# Patient Record
Sex: Male | Born: 1965 | Race: Black or African American | Hispanic: No | Marital: Single | Smoking: Current every day smoker
Health system: Southern US, Community
[De-identification: ages and names within clinical notes are randomized; demographics above are authoritative.]

## PROBLEM LIST (undated history)

## (undated) DIAGNOSIS — F329 Major depressive disorder, single episode, unspecified: Secondary | ICD-10-CM

## (undated) DIAGNOSIS — F32A Depression, unspecified: Secondary | ICD-10-CM

## (undated) HISTORY — PX: MANDIBLE FRACTURE SURGERY: SHX706

---

## 2014-10-12 ENCOUNTER — Emergency Department (HOSPITAL_COMMUNITY)
Admission: EM | Admit: 2014-10-12 | Discharge: 2014-10-12 | Disposition: A | Discharge status: Home / Self Care | Payer: Medicaid - Out of State | Attending: Emergency Medicine | Admitting: Emergency Medicine

## 2014-10-12 ENCOUNTER — Encounter (HOSPITAL_COMMUNITY): Payer: Self-pay | Admitting: Emergency Medicine

## 2014-10-12 DIAGNOSIS — M62838 Other muscle spasm: Secondary | ICD-10-CM | POA: Diagnosis not present

## 2014-10-12 DIAGNOSIS — M546 Pain in thoracic spine: Secondary | ICD-10-CM | POA: Insufficient documentation

## 2014-10-12 DIAGNOSIS — M549 Dorsalgia, unspecified: Secondary | ICD-10-CM

## 2014-10-12 DIAGNOSIS — M25512 Pain in left shoulder: Secondary | ICD-10-CM | POA: Insufficient documentation

## 2014-10-12 DIAGNOSIS — Z720 Tobacco use: Secondary | ICD-10-CM | POA: Diagnosis not present

## 2014-10-12 DIAGNOSIS — M542 Cervicalgia: Secondary | ICD-10-CM | POA: Diagnosis present

## 2014-10-12 MED ORDER — METHOCARBAMOL 750 MG PO TABS: 750.0000 mg | ORAL_TABLET | Freq: Four times a day (QID) | ORAL | Status: DC | PRN

## 2014-10-12 MED ORDER — OXYCODONE-ACETAMINOPHEN 5-325 MG PO TABS
1.0000 | ORAL_TABLET | Freq: Once | ORAL | Status: AC
  Administered 2014-10-12: 1 via ORAL
  Filled 2014-10-12: qty 1

## 2014-10-12 MED ORDER — OXYCODONE-ACETAMINOPHEN 5-325 MG PO TABS: 1.0000 | ORAL_TABLET | ORAL | Status: DC | PRN

## 2014-10-12 NOTE — ED Notes (Signed)
Pt c/o left neck pain that radiates to left shoulder. Pt states he had injury to that area 30 years ago. Denies re-injury.

## 2014-10-12 NOTE — ED Notes (Signed)
Pt reports he is leaving with ALL belongings he arrived with. He also verbalizes that his aunt is driving him home. He is alert and ambulatory upon DC

## 2014-10-12 NOTE — ED Provider Notes (Signed)
CSN: [id number]     Arrival date & time 10/12/14  1023 History   First MD Initiated Contact with Patient 10/12/14 1041     Chief Complaint  Patient presents with  . Neck Pain     (Consider location/radiation/quality/duration/timing/severity/associated sxs/prior Treatment) HPI   Pt p/w left upper back and left shoulder pain without injury.  Pain has been gradually worsening x 3 weeks.  States he woke up feeling like he had stiff neck, the pain has progressed to include his left upper back and left shoulder, pain is burning and sharp and tearing.   He can make the pain happen by turning his head to the left.  Denies fevers, CP, SOB, weakness or numbness of the arm.  Denies injury or heavy lifting.  Has been taking ibuprofen and has taken vicodin without relief.    History reviewed. No pertinent past medical history. Past Surgical History  Procedure Laterality Date  . Mandible fracture surgery     No family history on file. History  Substance Use Topics  . Smoking status: Current Every Day Smoker  . Smokeless tobacco: Not on file  . Alcohol Use: Yes    Review of Systems  All other systems reviewed and are negative.     Allergies  Review of patient's allergies indicates no known allergies.  Home Medications   Prior to Admission medications   Not on File   BP 140/92 mmHg  Pulse 60  Temp(Src) 98.5 F (36.9 C) (Oral)  Resp 20  SpO2 100% Physical Exam  Constitutional: He appears well-developed and well-nourished. No distress.  HENT:  Head: Normocephalic and atraumatic.  Neck: Neck supple.  Pulmonary/Chest: Effort normal.  Musculoskeletal:       Back:  Spine nontender, no crepitus, or stepoffs. Upper extremities:  Strength 5/5, sensation intact, distal pulses intact.    Mild edema and tenderness over left trapezius.  No erythema.  No masses.    Neurological: He is alert.  Skin: He is not diaphoretic.  Nursing note and vitals reviewed.   ED Course  Procedures  (including critical care time) Labs Review Labs Reviewed - No data to display  Imaging Review No results found.   EKG Interpretation None      MDM   Final diagnoses:  Upper back pain on left side  Muscle spasm    Afebrile, nontoxic patient with left upper back and shoulder pain without injury.  Neurovascularly intact.  Suspect muscle spasm.   D/C home with robaxin, #10 percocet, PCP follow up.   Discussed result, findings, treatment, and follow up  with patient.  Pt given return precautions.  Pt verbalizes understanding and agrees with plan.        Trixie Dredgemily Emersen Mascari, PA-C 10/12/14 1417  Richardean Canalavid H Yao, MD 10/12/14 [phone]

## 2014-10-12 NOTE — Discharge Instructions (Signed)
Read the information below.  Use the prescribed medication as directed.  Please discuss all new medications with your pharmacist.  Do not take additional tylenol while taking the prescribed pain medication to avoid overdose.  You may return to the Emergency Department at any time for worsening condition or any new symptoms that concern you.  If you develop uncontrolled pain, weakness or numbness of the extremity, severe discoloration of the skin, or you are unable to move your arm, return to the ER for a recheck.       Muscle Cramps and Spasms Muscle cramps and spasms are when muscles tighten by themselves. They usually get better within minutes. Muscle cramps are painful. They are usually stronger and last longer than muscle spasms. Muscle spasms may or may not be painful. They can last a few seconds or much longer. HOME CARE  Drink enough fluid to keep your pee (urine) clear or pale yellow.  Massage, stretch, and relax the muscle.  Use a warm towel, heating pad, or warm shower water on tight muscles.  Place ice on the muscle if it is tender or in pain.  Put ice in a plastic bag.  Place a towel between your skin and the bag.  Leave the ice on for 15-20 minutes, 03-04 times a day.  Only take medicine as told by your doctor. GET HELP RIGHT AWAY IF:  Your cramps or spasms get worse, happen more often, or do not get better with time. MAKE SURE YOU:  Understand these instructions.  Will watch your condition.  Will get help right away if you are not doing well or get worse. Document Released: 10/26/2008 Document Revised: 03/10/2013 Document Reviewed: 10/30/2012 [facility] Patient Information 2015 [place], [place]. This information is not intended to replace advice given to you by your health care provider. Make sure you discuss any questions you have with your health care provider.  Musculoskeletal Pain Musculoskeletal pain is muscle and boney aches and pains. These pains can occur in any  part of the body. Your caregiver may treat you without knowing the cause of the pain. They may treat you if blood or urine tests, X-rays, and other tests were normal.  CAUSES There is often not a definite cause or reason for these pains. These pains may be caused by a type of germ (virus). The discomfort may also come from overuse. Overuse includes working out too hard when your body is not fit. Boney aches also come from weather changes. Bone is sensitive to atmospheric pressure changes. HOME CARE INSTRUCTIONS   Ask when your test results will be ready. Make sure you get your test results.  Only take over-the-counter or prescription medicines for pain, discomfort, or fever as directed by your caregiver. If you were given medications for your condition, do not drive, operate machinery or power tools, or sign legal documents for 24 hours. Do not drink alcohol. Do not take sleeping pills or other medications that may interfere with treatment.  Continue all activities unless the activities cause more pain. When the pain lessens, slowly resume normal activities. Gradually increase the intensity and duration of the activities or exercise.  During periods of severe pain, bed rest may be helpful. Lay or sit in any position that is comfortable.  Putting ice on the injured area.  Put ice in a bag.  Place a towel between your skin and the bag.  Leave the ice on for 15 to 20 minutes, 3 to 4 times a day.  Follow  up with your caregiver for continued problems and no reason can be found for the pain. If the pain becomes worse or does not go away, it may be necessary to repeat tests or do additional testing. Your caregiver may need to look further for a possible cause. SEEK IMMEDIATE MEDICAL CARE IF:  You have pain that is getting worse and is not relieved by medications.  You develop chest pain that is associated with shortness or breath, sweating, feeling sick to your stomach (nauseous), or throw up  (vomit).  Your pain becomes localized to the abdomen.  You develop any new symptoms that seem different or that concern you. MAKE SURE YOU:   Understand these instructions.  Will watch your condition.  Will get help right away if you are not doing well or get worse. Document Released: 11/13/2005 Document Revised: 02/05/2012 Document Reviewed: 07/18/2013 [facility] Patient Information 2015 [place]. This information is not intended to replace advice given to you by your health care provider. Make sure you discuss any questions you have with your health care provider.   Emergency Department Resource Guide 1) Find a Doctor and Pay Out of Pocket Although you won't have to find out who is covered by your insurance plan, it is a good idea to ask around and get recommendations. You will then need to call the office and see if the doctor you have chosen will accept you as a new patient and what types of options they offer for patients who are self-pay. Some doctors offer discounts or will set up payment plans for their patients who do not have insurance, but you will need to ask so you aren't surprised when you get to your appointment.  2) Contact Your Local Health Department Not all health departments have doctors that can see patients for sick visits, but many do, so it is worth a call to see if yours does. If you don't know where your local health department is, you can check in your phone book. The CDC also has a tool to help you locate your state's health department, and many state websites also have listings of all of their local health departments.  3) Find a Walk-in Clinic If your illness is not likely to be very severe or complicated, you may want to try a walk in clinic. These are popping up all over the country in pharmacies, drugstores, and shopping centers. They're usually staffed by nurse practitioners or physician assistants that have been trained to treat common illnesses and  complaints. They're usually fairly quick and inexpensive. However, if you have serious medical issues or chronic medical problems, these are probably not your best option.  No Primary Care Doctor: - Call Health Connect at  [phone] - they can help you locate a primary care doctor that  accepts your insurance, provides certain services, etc. - Physician Referral Service- [phone]  Chronic Pain Problems: Organization         Address  Phone   Notes  [facility]  [phone] Patients need to be referred by their primary care doctor.   Medication Assistance: Organization         Address  Phone   Notes  [facility] [address] [place], [place] [address] [phone] --Must be a resident of [facility] [place] -- Must have NO insurance coverage whatsoever (no Medicaid/ Medicare, etc.) -- The pt. MUST have a primary care doctor that directs their care regularly and  follows them in the community   [facility]ist  [phone]   [employer]  [phone]    Agencies that provide inexpensive medical care: Organization         Address  Phone   Notes  [facility]  [phone]   [facility]    [phone]   [facility] [address] [place], [place] [address] [phone]   [facility] [address], [place] [phone]   Planned Parenthood    [phone]   [facility]    [phone]   [facility]  [address], [place] Phone:  [phone], Fax:  [phone] Hours of Operation:  9 am - 6 pm, M-F.  Also accepts Medicaid/Medicare and self-pay.  [facility]  [address], [place] Phone: [phone], Fax: [phone]. Hours of Operation:  8:30 am - 5:30 pm, M-F.  Also accepts Medicaid and self-pay.  [facility] [place] [address] [place] Phone: [phone]   [facility] [address] Natasha Bence [place], [place] [phone], Ext. [phone] Mondays & Thursdays: 7-9 AM.  First 15 patients are seen on a first come, first serve basis.    Medicaid-accepting [facility] Providers:  Organization         Address  Phone   Notes  [facility] [address], [place] [phone] Also accepts self-pay patients.  [facility] [address] Laurell Josephs [place], [place]  [phone]   [facility] [address], [place] [phone]   [facility] [address] [place] [phone]   Renaye Rakers [address], [place]   [phone] Only accepts [place] Access [place] patients after they have their name applied to their card.   Self-Pay (no insurance) in [facility]:  Organization         Address  Phone   Notes  Sickle Cell Patients, [facility] [address] [place], [place] [phone]   [facility] [address] [place], [place] [phone]   [facility] [place]  [address], [place] [phone]   [facility]/Dr. Osei-Bonsu  [address], [place] or [address], [place] [phone] Phone number for both [place] and [place] locations is the same.  Urgent Medical and [facility] [address] [place] [phone]   [facility] [address] [place] or [address] [phone]   [facility] [address] [place] [phone], phone; [phone], fax Sees patients 1st and 3rd Saturday of every month.  Must not qualify for public or private insurance (i.e. Medicaid, Medicare, [place] Health Choice, Veterans' Benefits)  Household income should be no more than 200% of the poverty level  The clinic cannot treat you if you are pregnant or think you are pregnant  Sexually transmitted diseases are not treated at the clinic.    Dental Care: Organization         Address  Phone  Notes  [facility] [address] [place], [place] [phone] Accepts children up to age 65 who are  enrolled in Medicaid or [place] Health Choice; pregnant women with a Medicaid card; and children who have applied for Medicaid or [place] Health Choice, but were declined, whose parents can pay a reduced fee at time of service.  [facility] [place]  [address], [place] [phone] Accepts children up to age 48 who are enrolled in [place] or [place] Health Choice; pregnant women with a Medicaid card; and children who have applied for Medicaid or [place] Health Choice, but were declined, whose parents can pay a reduced fee at time of service.  [facility]  [address], [place] [phone] Patients are seen by appointment only. Walk-ins are not accepted. [facility] will see patients 48 years of age and older. Monday - Tuesday (8am-5pm) Most Wednesdays (8:30-5pm) $30 per visit, cash only  [facility]  [address], [facility] [phone] Patients are seen by appointment only. Walk-ins are not accepted. [facility] will see patients 48 years of age and older. One Wednesday Evening (Monthly: Volunteer Based).  $30 per visit, cash only  [employer] [facility] [facility]  [phone] for adults; Children under age 494, call Graduate Pediatric Dentistry at [phone]. Children aged 474-14, please call [phone] to request a pediatric application.  Dental services are provided in all areas of dental care including fillings, crowns and bridges, complete and partial dentures, implants, gum treatment, root canals, and extractions. Preventive care is  also provided. Treatment is provided to both adults and children. Patients are selected via a lottery and there is often a waiting list.   [facility] [address], [place]  [phone] [email or web address]   [facility] [address], [place], [place] [phone], Ext. [phone] Second and Fourth Thursday of each month, opens at 6:30 AM; Clinic ends at 9 AM.  Patients are seen on a first-come first-served basis, and a limited number are seen during each clinic.   [facility]  [address], [place], [place] [phone]   Eligibility Requirements You must have lived in [place], [place], or [place] counties for at least the last three months.   You cannot be eligible for state or federal sponsored [place] Cityhealthcare insurance, including CIGNAVeterans Administration, [place]icaid, or [employer]Medicare.   You generally cannot be eligible for healthcare insurance through your employer.    How to apply: Eligibility screenings are held every Tuesday and Wednesday afternoon from 1:00 pm until 4:00 pm. You do not need an appointment for the interview!  [facility] [address], [place], [place] [phone]   [facility]  [phone]   [facility]  [phone]   [facility]  [phone]    Behavioral Health Resources in the Community: Intensive Outpatient Programs Organization         Address  Phone  Notes  [facility] [address], [place], [place] [phone]   [facility] [address], [place], [place] [phone]   [facility] [address], [place], [place]  [phone]   [facility] [address],  [place], [place] [phone] or [phone]   Substance Abuse Resources Organization         Address  Phone  Notes  [facility] Drug Services  [phone]   [facility]  [phone]  [facility]  [phone]   Floydene Flock  [phone]   Residential & Outpatient Substance Abuse Program  [phone]   Psychological Services Organization         Address  Phone  Notes  [facility] Behavioral Health  [phone]   [facility] Services  [phone]   [facility] Mental Health [address] [place] [phone] or [phone]    Mobile Crisis Teams Organization         Address  Phone  Notes  [facility], Mobile Crisis Care Unit  [phone]   [facility]  [address] [place], [place] [phone]   Doristine Locks [address] [place] [phone]    Self-Help/Support Groups Organization         Address  Phone             Notes  [facility] [place] - variety of support groups  336- I7437963 Call for more information  Narcotics Anonymous (NA), [facility] [address], [employer] [place]  2 meetings at this location   Statistician         Address  Phone  Notes  [facility] [address],    [place]  [phone]   [facility]  [address] [place] [address], [place], [place] [phone]   [facility] [address] [place], [place] [phone] Admissions: 8am-3pm M-F  [facility] [address],    [place], [place] [phone]   [facility] [address] [place], [place], [place] [phone]   [facility] [address]  [place], [place] [phone]   [facility] - Intensive Outpatient [address] Laurell Josephs [address], [place], [place] [phone]   [facility] ([facility] [address]  [place], [place] [phone] or [phone]   [facility] ([facility]) [address] [place], [place] [phone] Accepts Medicaid  [facility] [address]  [place] [phone]  Substance Abuse/Addiction Treatment   [facility] Organization         Address  Phone  Notes  [facility]  [phone]   Angie Fava, PhD [address] Ervin Knack [place], [place]   [phone] or [phone]   [facility] Behavioral   [address] [place], [place] [phone]   [facility] [address] [place], [place] [phone] Insurance/Medicaid/sponsorship through [facility] and Families [address]                                    [place], [place] [phone] Therapy/tele-psych/case  [facility] [address], [place] [phone]    Dr. Lolly Mustache  [phone]   [facility]  [employer] [facility] Dept. 1) [address] [place] 2) [address] [place] 3)  [address], [place] [phone]  [phone]   [facility] Child Abuse Hotline [phone] or 6467626436 (After Hours)

## 2014-10-18 ENCOUNTER — Encounter (HOSPITAL_COMMUNITY): Payer: Self-pay | Admitting: Emergency Medicine

## 2014-10-18 ENCOUNTER — Emergency Department (HOSPITAL_COMMUNITY)
Admission: EM | Admit: 2014-10-18 | Discharge: 2014-10-18 | Disposition: A | Discharge status: Home / Self Care | Payer: Medicaid - Out of State | Attending: Emergency Medicine | Admitting: Emergency Medicine

## 2014-10-18 DIAGNOSIS — Z87828 Personal history of other (healed) physical injury and trauma: Secondary | ICD-10-CM | POA: Insufficient documentation

## 2014-10-18 DIAGNOSIS — Z720 Tobacco use: Secondary | ICD-10-CM | POA: Insufficient documentation

## 2014-10-18 DIAGNOSIS — M62838 Other muscle spasm: Secondary | ICD-10-CM | POA: Insufficient documentation

## 2014-10-18 DIAGNOSIS — M549 Dorsalgia, unspecified: Secondary | ICD-10-CM

## 2014-10-18 DIAGNOSIS — M546 Pain in thoracic spine: Secondary | ICD-10-CM | POA: Diagnosis not present

## 2014-10-18 MED ORDER — KETOROLAC TROMETHAMINE 60 MG/2ML IM SOLN
30.0000 mg | Freq: Once | INTRAMUSCULAR | Status: AC
  Administered 2014-10-18: 30 mg via INTRAMUSCULAR
  Filled 2014-10-18: qty 2

## 2014-10-18 MED ORDER — METHOCARBAMOL 500 MG PO TABS: 500.0000 mg | ORAL_TABLET | Freq: Two times a day (BID) | ORAL | Status: DC | PRN

## 2014-10-18 MED ORDER — NAPROXEN 500 MG PO TABS: 500.0000 mg | ORAL_TABLET | Freq: Two times a day (BID) | ORAL | Status: DC

## 2014-10-18 NOTE — ED Notes (Signed)
Pt arrived to the ED with a complaint of left sided back pain.  Pt was seen for same and given medications and a referral but has not gained any relief from medications and has been unable to get a appointment at Doctor referred to.

## 2014-10-18 NOTE — ED Notes (Addendum)
Assumed care of patient Patient seen in this ED for same complaint on 11/16 Patient able to move all extremities without difficulty Patient medicated for pain, see MAR Patient in NAD

## 2014-10-18 NOTE — Discharge Instructions (Signed)
Back Exercises °Back exercises help treat and prevent back injuries. The goal of back exercises is to increase the strength of your abdominal and back muscles and the flexibility of your back. These exercises should be started when you no longer have back pain. Back exercises include: °· Pelvic Tilt. Lie on your back with your knees bent. Tilt your pelvis until the lower part of your back is against the floor. Hold this position 5 to 10 sec and repeat 5 to 10 times. °· Knee to Chest. Pull first 1 knee up against your chest and hold for 20 to 30 seconds, repeat this with the other knee, and then both knees. This may be done with the other leg straight or bent, whichever feels better. °· Sit-Ups or Curl-Ups. Bend your knees 90 degrees. Start with tilting your pelvis, and do a partial, slow sit-up, lifting your trunk only 30 to 45 degrees off the floor. Take at least 2 to 3 seconds for each sit-up. Do not do sit-ups with your knees out straight. If partial sit-ups are difficult, simply do the above but with only tightening your abdominal muscles and holding it as directed. °· Hip-Lift. Lie on your back with your knees flexed 90 degrees. Push down with your feet and shoulders as you raise your hips a couple inches off the floor; hold for 10 seconds, repeat 5 to 10 times. °· Back arches. Lie on your stomach, propping yourself up on bent elbows. Slowly press on your hands, causing an arch in your low back. Repeat 3 to 5 times. Any initial stiffness and discomfort should lessen with repetition over time. °· Shoulder-Lifts. Lie face down with arms beside your body. Keep hips and torso pressed to floor as you slowly lift your head and shoulders off the floor. °Do not overdo your exercises, especially in the beginning. Exercises may cause you some mild back discomfort which lasts for a few minutes; however, if the pain is more severe, or lasts for more than 15 minutes, do not continue exercises until you see your caregiver.  Improvement with exercise therapy for back problems is slow.  °See your caregivers for assistance with developing a proper back exercise program. °Document Released: 12/21/2004 Document Revised: 02/05/2012 Document Reviewed: 09/14/2011 °ExitCare® Patient Information ©2015 [employer]. This information is not intended to replace advice given to you by your health care provider. Make sure you discuss any questions you have with your health care provider. °Back Pain, Adult °Low back pain is very common. About 1 in 5 people have back pain. The cause of low back pain is rarely dangerous. The pain often gets better over time. About half of people with a sudden onset of back pain feel better in just 2 weeks. About 8 in 10 people feel better by 6 weeks.  °CAUSES °Some common causes of back pain include: °· Strain of the muscles or ligaments supporting the spine. °· Wear and tear (degeneration) of the spinal discs. °· Arthritis. °· Direct injury to the back. °DIAGNOSIS °Most of the time, the direct cause of low back pain is not known. However, back pain can be treated effectively even when the exact cause of the pain is unknown. Answering your caregiver's questions about your overall health and symptoms is one of the most accurate ways to make sure the cause of your pain is not dangerous. If your caregiver needs more information, he or she may order lab work or imaging tests (X-rays or MRIs). However, even if imaging tests show changes in your back,   this usually does not require surgery. °HOME CARE INSTRUCTIONS °For many people, back pain returns. Since low back pain is rarely dangerous, it is often a condition that people can learn to manage on their own.  °· Remain active. It is stressful on the back to sit or stand in one place. Do not sit, drive, or stand in one place for more than 30 minutes at a time. Take short walks on level surfaces as soon as pain allows. Try to increase the length of time you walk each  day. °· Do not stay in bed. Resting more than 1 or 2 days can delay your recovery. °· Do not avoid exercise or work. Your body is made to move. It is not dangerous to be active, even though your back may hurt. Your back will likely heal faster if you return to being active before your pain is gone. °· Pay attention to your body when you  bend and lift. Many people have less discomfort when lifting if they bend their knees, keep the load close to their bodies, and avoid twisting. Often, the most comfortable positions are those that put less stress on your recovering back. °· Find a comfortable position to sleep. Use a firm mattress and lie on your side with your knees slightly bent. If you lie on your back, put a pillow under your knees. °· Only take over-the-counter or prescription medicines as directed by your caregiver. Over-the-counter medicines to reduce pain and inflammation are often the most helpful. Your caregiver may prescribe muscle relaxant drugs. These medicines help dull your pain so you can more quickly return to your normal activities and healthy exercise. °· Put ice on the injured area. °¨ Put ice in a plastic bag. °¨ Place a towel between your skin and the bag. °¨ Leave the ice on for 15-20 minutes, 03-04 times a day for the first 2 to 3 days. After that, ice and heat may be alternated to reduce pain and spasms. °· Ask your caregiver about trying back exercises and gentle massage. This may be of some benefit. °· Avoid feeling anxious or stressed. Stress increases muscle tension and can worsen back pain. It is important to recognize when you are anxious or stressed and learn ways to manage it. Exercise is a great option. °SEEK MEDICAL CARE IF: °· You have pain that is not relieved with rest or medicine. °· You have pain that does not improve in 1 week. °· You have new symptoms. °· You are generally not feeling well. °SEEK IMMEDIATE MEDICAL CARE IF:  °· You have pain that radiates from your back into  your legs. °· You develop new bowel or bladder control problems. °· You have unusual weakness or numbness in your arms or legs. °· You develop nausea or vomiting. °· You develop abdominal pain. °· You feel faint. °Document Released: 11/13/2005 Document Revised: 05/14/2012 Document Reviewed: 03/17/2014 °ExitCare® Patient Information ©2015 [employer]. This information is not intended to replace advice given to you by your health care provider. Make sure you discuss any questions you have with your health care provider. ° ° °Emergency Department Resource Guide °1) Find a Doctor and Pay Out of Pocket °Although you won't have to find out who is covered by your insurance plan, it is a good idea to ask around and get recommendations. You will then need to call the office and see if the doctor you have chosen will accept you as a new patient and what types of options they offer for patients who are self-pay. Some doctors offer discounts or will set   up payment plans for their patients who do not have insurance, but you will need to ask so you aren't surprised when you get to your appointment. ° °2) Contact Your Local Health Department °Not all health departments have doctors that can see patients for sick visits, but many do, so it is worth a call to see if yours does. If you don't know where your local health department is, you can check in your phone book. The CDC also has a tool to help you locate your state's health department, and many state websites also have listings of all of their local health departments. ° °3) Find a Walk-in Clinic °If your illness is not likely to be very severe or complicated, you may want to try a walk in clinic. These are popping up all over the country in pharmacies, drugstores, and shopping centers. They're usually staffed by nurse practitioners or physician assistants that have been trained to treat common illnesses and complaints. They're usually fairly quick and inexpensive. However,  if you have serious medical issues or chronic medical problems, these are probably not your best option. ° °No Primary Care Doctor: °- Call [facility] at  832-8000 - they can help you locate a primary care doctor that  accepts your insurance, provides certain services, etc. °- Physician Referral Service- 1-800-533-3463 ° °Chronic Pain Problems: °Organization         Address  Phone   Notes  °[facility]  (336) 297-2271 Patients need to be referred by their primary care doctor.  ° °Medication Assistance: °Organization         Address  Phone   Notes  °[place] [facility] [address] °[place], [place] [address] (336) 641-8030 --Must be a resident of [place] °-- Must have NO insurance coverage whatsoever (no Medicaid/ Medicare, etc.) °-- The pt. MUST have a primary care doctor that directs their care regularly and follows them in the community °  °[facility]  (866) 331-1348   °[employer]  (888) 892-1162   ° °Agencies that provide inexpensive medical care: °Organization         Address  Phone   Notes  °[facility]  (336) 832-8035   ° Internal Medicine    (336) 832-7272   °[facility] [address] °[place], [place] [address] (336) 832-4777   °[facility] [address], °[place] (336) 271-4999   °[facility]    (336) 373-0678   °[facility]    (336) 272-1050   °[facility] ° [address], [place] Phone:  (336) 832-4444, Fax:  (336) 832-4440 Hours of Operation:  9 am - 6 pm, M-F.  Also accepts Medicaid/Medicare and self-pay.  °[facility] ° [address], [place] Phone: (336) 832-3150, Fax: (336) [phone]. Hours of Operation:  8:30 am - 5:30 pm, M-F.  Also accepts Medicaid and self-pay.  °[facility] [place] [address], [place] Phone: (336) 878-6027   °[facility] [address], [place], [place] (336)723-1848, Ext. 123 Mondays & Thursdays: 7-9 AM.  First 15 patients are seen on a first come, first serve basis. °  ° °Medicaid-accepting [place] Providers: ° °Organization         Address  Phone   Notes  °[facility] [address], [place] (336) 641-2100 Also   accepts self-pay patients.  °[facility] [address], [place] ° (336) 856-9996   °[facility] [address], [place] (336) 288-8857   °[facility] [address] [place] [address], [place] (336) 299-7000   °Veita Bland [address], [place]  ° (336) 373-1557 Only accepts [place] Access Medicaid patients after they have their name applied to their card.  ° °Self-Pay (no insurance) in [place]: ° °Organization         Address  Phone   Notes  °Sickle Cell Patients, [facility] [address], [place] (336) 832-1970   °[facility] [address], [place] (336) 832-4400   ° [facility] ° [address], [place] (336) 992-4800   °[facility]/Dr. Osei-Bonsu ° [address] [place] [address], [place] or [address], [place] (336) 841-8500 Phone number for both [place] and [place] locations is the same.  °[facility] [address], [place] (336) 299-0000   °[facility] [address] [place] [address], [place] or [address] (336) 852-7530 °(336) 878-2260   °[facility] [address], [place] (336) 350-1642, phone; (336) 294-5005, fax Sees patients 1st and 3rd Saturday of every month.  Must not qualify for public or private insurance (i.e. Medicaid, Medicare, [place] Health Choice, Veterans' Benefits) • Household income should be no more than 200% of the poverty level •The clinic cannot treat you if you are pregnant or think you are  pregnant • Sexually transmitted diseases are not treated at the clinic.  ° ° °Dental Care: °Organization         Address  Phone  Notes  °[facility] [facility] [address], [place] (336) 641-6152 Accepts children up to age 21 who are enrolled in Medicaid or [place] Health Choice; pregnant women with a Medicaid card; and children who have applied for Medicaid or [place] Health Choice, but were declined, whose parents can pay a reduced fee at time of service.  °[facility] [place]  [address], [place] (336) 641-7733 Accepts children up to age 21 who are enrolled in Medicaid or [place] Health Choice; pregnant women with a Medicaid card; and children who have applied for Medicaid or [place] Health Choice, but were declined, whose parents can pay a reduced fee at time of service.  °[facility] ° [address], [place] (336) 641-4533 Patients are seen by appointment only. Walk-ins are not accepted. [facility] will see patients 18 years of age and older. °Monday - Tuesday (8am-5pm) °Most Wednesdays (8:30-5pm) °$30 per visit, cash only  °[facility] ° [address], [place] (336) 641-4533 Patients are seen by appointment only. Walk-ins are not accepted. [facility] will see patients 18 years of age and older. °One Wednesday Evening (Monthly: Volunteer Based).  $30 per visit, cash only  °[facility]  (919) 537-3737 for adults; Children under age 4, call Graduate Pediatric Dentistry at ([phone]) [phone]. Children aged 4-14, please call ([phone]) [phone] to request a pediatric application. ° Dental services are provided in all areas of dental care including fillings, crowns and bridges, complete and partial dentures, implants, gum treatment, root canals, and extractions. Preventive care is also provided. Treatment is provided   to both adults and  children. °Patients are selected via a lottery and there is often a waiting list. °  °[facility] [address], °[place] ° (336) 763-8833 [email or web address] °  °[facility] [address], [place], [place] (336)723-1848, Ext. 123 Second and Fourth Thursday of each month, opens at 6:30 AM; Clinic ends at 9 AM.  Patients are seen on a first-come first-served basis, and a limited number are seen during each clinic.  ° °[facility] ° [address], [place], [place] (336) 723-7904   Eligibility Requirements °You must have lived in [place], [place], or [place] counties for at least the last three months. °  You cannot be eligible for state or federal sponsored healthcare insurance, including Veterans Administration, Medicaid, or Medicare. °  You generally cannot be eligible for healthcare insurance through your employer.  °  How to apply: °Eligibility screenings are held every Tuesday and Wednesday afternoon from 1:00 pm until 4:00 pm. You do not need an appointment for the interview!  °[facility] [address], [place], [place] 336-631-2330   °[facility]  336-342-8273   °[facility]  336-703-3100   °[facility]  336-570-6415   ° °Behavioral Health Resources in the Community: °Intensive Outpatient Programs °Organization         Address  Phone  Notes  °[facility] [address], [place], [place] 336-878-6098   °[facility] [address], [place], [place] 336-832-9800   °[facility] [address], [place], [place] ° 336-882-2125   °[facility] [address],  °[place], [place] 1-800-853-5163 or 336-641-4981   °Substance Abuse Resources °Organization         Address  Phone  Notes  °[facility]  336-882-2125   °[facility]  336-784-9470   °[facility]  336-285-9073    °[facility]  336-845-3988   °Residential & Outpatient Substance Abuse Program  1-800-659-3381   °Psychological Services °Organization         Address  Phone  Notes  °[facility]  336- 832-9600   °[facility]  336- 378-7881   °[facility] [address], [place] 1-800-853-5163 or 336-641-4981   ° °Mobile Crisis Teams °Organization         Address  Phone  Notes  °[facility], Mobile Crisis Care Unit  1-877-626-1772   °Assertive °[facility] ° [address] [place], [place] 336-834-9664   °Sharon DeEsch [address] °[place] 336-554-5454   ° °Self-Help/Support Groups °Organization         Address  Phone             Notes  °[facility] - variety of support groups  336- 373-1402 Call for more information  °Narcotics Anonymous (NA), [facility] [address], °[place]  2 meetings at this location  ° °Residential Treatment Programs °Organization         Address  Phone  Notes  °[facility] [address],    °[place]  1-866-801-8205   °[facility] ° [address], [place], [place] 704-293-8524   °[facility] [address], [place] 336-845-3988 Admissions: 8am-3pm M-F  °[facility] [address]    °[place],  336-841-1104   °  [facility] [address], [place], [place] 336-379-7146   °[facility] [address]  °[place], [place] 336-285-9073   °[facility] Intensive Outpatient [address], [place], [place] 336-852-3033   °[facility] [address]  °[place], [place] 1-877-615-2722 or 336-784-9470   °[facility] [address] [place], [place] 336-227-7417 Accepts Medicaid  °[facility] [address]  °[place] 1-800-659-3381 Substance Abuse/Addiction Treatment  ° °[place]  Behavioral Health Resources °Organization         Address  Phone  Notes  °[facility]  (888) 581-9988   °Julie Brannon, PhD [address] [place], [place]   (336) 349-5553 or (336) 951-0000   °[facility]   [address] °[place], [place] (336) 349-4454   °[facility] [address], [place], [place] (336) 342-8316 Insurance/Medicaid/sponsorship through [facility]  °[facility] [address]                                    [place], [place] (336) 342-8316 Therapy/tele-psych/case  °[facility] [address]  ° [place], [place] (336) 349-2233    °Dr. Arfeen  (336) 349-4544   °[facility] [place]  [employer] [place] [facility] 1) [address], [place] °2) [address], [place] °3)  [address], [place] (336) 349-3220 °(336) 342-7768 ° °(336) 342-8140   °[place] [facility] (336) 342-1394 or (336) 342-3537 (After Hours)    ° ° ° °

## 2014-10-18 NOTE — ED Notes (Signed)
MD at bedside. 

## 2014-10-18 NOTE — ED Provider Notes (Signed)
CSN: [id number]     Arrival date & time 10/18/14  [id number] History   First MD Initiated Contact with Patient 10/18/14 0700     Chief Complaint  Patient presents with  . Back Pain  . Hand Pain   Edward Mcbride is a 48 y.o. male with no known medical problems who presents to the ED complaining of left-sided upper back pain for the past 4 weeks without injury. Patient was seen here on 10/12/2014 for the same. Patient reports that Percocet and Robaxin did not help his pain much and he is having continued pain. Reports he has not been able to follow-up with primary care provider and has had difficulty making an appointment. Patient works 10/10 sharp stabbing pain in his left trapezius muscle. This pain is worse when turning his head to his left. His pain is worse when lying on his side in bed. He reports using ice packs with some relief. He denies injury to his shoulder, back or neck. He denies weakness, numbness, tingling, loss of range of motion. He has fevers, chills, abdominal pain, lower back pain, dysuria, hematuria. Denies history of cancer, or IV drug use.  (Consider location/radiation/quality/duration/timing/severity/associated sxs/prior Treatment) The history is provided by the patient.    History reviewed. No pertinent past medical history. Past Surgical History  Procedure Laterality Date  . Mandible fracture surgery     History reviewed. No pertinent family history. History  Substance Use Topics  . Smoking status: Current Every Day Smoker  . Smokeless tobacco: Not on file  . Alcohol Use: Yes    Review of Systems  Constitutional: Negative for fever, chills and fatigue.  HENT: Negative for congestion, ear pain, facial swelling, rhinorrhea, sneezing, sore throat and trouble swallowing.   Eyes: Negative for pain and visual disturbance.  Respiratory: Negative for cough, shortness of breath and wheezing.   Cardiovascular: Negative for chest pain and palpitations.  Gastrointestinal:  Negative for nausea, vomiting, abdominal pain and diarrhea.  Genitourinary: Negative for dysuria, hematuria, decreased urine volume and difficulty urinating.  Musculoskeletal: Positive for back pain. Negative for joint swelling, gait problem and neck stiffness.       Left upper back pain  Skin: Negative for rash.  Neurological: Negative for dizziness, tremors, weakness, light-headedness, numbness and headaches.  All other systems reviewed and are negative.     Allergies  Vicodin  Home Medications   Prior to Admission medications   Medication Sig Start Date End Date Taking? Authorizing Provider  methocarbamol (ROBAXIN) 500 MG tablet Take 1 tablet (500 mg total) by mouth 2 (two) times daily as needed for muscle spasms. 10/18/14   Einar GipWilliam Duncan Lafe Clerk, PA-C  naproxen (NAPROSYN) 500 MG tablet Take 1 tablet (500 mg total) by mouth 2 (two) times daily with a meal. 10/18/14   Einar GipWilliam Duncan Anika Shore, PA-C  oxyCODONE-acetaminophen (PERCOCET/ROXICET) 5-325 MG per tablet Take 1-2 tablets by mouth every 4 (four) hours as needed for moderate pain or severe pain. 10/12/14   Trixie DredgeEmily West, PA-C   BP 146/97 mmHg  Pulse 80  Temp(Src) 97.3 F (36.3 C) (Oral)  Resp 16  SpO2 100% Physical Exam  Constitutional: He is oriented to person, place, and time. He appears well-developed and well-nourished. No distress.  HENT:  Head: Normocephalic and atraumatic.  Mouth/Throat: Oropharynx is clear and moist. No oropharyngeal exudate.  Eyes: Conjunctivae are normal. Pupils are equal, round, and reactive to light. Right eye exhibits no discharge. Left eye exhibits no discharge.  Neck: Normal range of motion. Neck  supple.  Cardiovascular: Normal rate, regular rhythm, normal heart sounds and intact distal pulses.  Exam reveals no gallop and no friction rub.   No murmur heard. Bilateral radial pulses intact.   Pulmonary/Chest: Effort normal and breath sounds normal. No respiratory distress. He has no wheezes. He has  no rales.  Abdominal: Soft. There is no tenderness.  Musculoskeletal: Normal range of motion. He exhibits no edema.  Mild tenderness to his left trapezius muscle. No edema, deformity, erythema or ecchymosis noted. No spinous process tenderness. No bony tenderness. No crepitus or step-offs. Strength 5 out of 5 in his bilateral upper extremities. Equal grip strength. Patient has full range of motion of his bilateral upper extremities.  Lymphadenopathy:    He has no cervical adenopathy.  Neurological: He is alert and oriented to person, place, and time. No cranial nerve deficit. Coordination normal.  Sensation is intact in his bilateral upper extremities.   Skin: Skin is warm and dry. No rash noted. He is not diaphoretic. No erythema. No pallor.  Psychiatric: He has a normal mood and affect. His behavior is normal.  Nursing note and vitals reviewed.   ED Course  Procedures (including critical care time) Labs Review Labs Reviewed - No data to display  Imaging Review No results found.   EKG Interpretation None      Filed Vitals:   10/18/14 0703  BP: 146/97  Pulse: 80  Temp: 97.3 F (36.3 C)  TempSrc: Oral  Resp: 16  SpO2: 100%     MDM   Meds given in ED:  Medications  ketorolac (TORADOL) injection 30 mg (30 mg Intramuscular Given 10/18/14 0731)    Discharge Medication List as of 10/18/2014  7:59 AM    START taking these medications   Details  naproxen (NAPROSYN) 500 MG tablet Take 1 tablet (500 mg total) by mouth 2 (two) times daily with a meal., Starting 10/18/2014, Until Discontinued, Print        Final diagnoses:  Upper back pain on left side  Muscle spasm   Edward Mcbride is a 48 y.o. male with no known medical problems who presents to the ED complaining of left-sided upper back pain for the past 4 weeks without injury. Patient was seen here on 10/12/2014 for the same. Patient has not seen primary care since his ED visit. Patient reports he is unable to make  an appointment. Patient is neurovascularly intact. He has no weakness. He has no history of injury. Patient reports he had almost complete relief of his pain with IM Toradol. Advised he needed to follow-up with primary care. I gave him follow-up instructions [facility]. Also provided resource list of other primary care providers.  Patient was discharged with Naprosyn and Robaxin. I Advised he needed to follow-up with primary care this week. Advised to return to ED with new worsening symptoms or new concerns. Patient verbalized understanding and agreement with plan.   Patient was discussed with PA Kirichenko who agrees with assessment and plan.       Edward ChambersWilliam Duncan Kelsha Older, PA-C 10/18/14 1001  Derwood KaplanAnkit Nanavati, MD 10/18/14 1650

## 2016-02-09 ENCOUNTER — Encounter (HOSPITAL_COMMUNITY): Payer: Self-pay | Admitting: Emergency Medicine

## 2016-02-09 ENCOUNTER — Emergency Department (HOSPITAL_COMMUNITY)
Admission: EM | Admit: 2016-02-09 | Discharge: 2016-02-09 | Disposition: A | Discharge status: Home / Self Care | Payer: Self-pay | Attending: Emergency Medicine | Admitting: Emergency Medicine

## 2016-02-09 ENCOUNTER — Emergency Department (HOSPITAL_COMMUNITY): Payer: Self-pay

## 2016-02-09 DIAGNOSIS — Y9301 Activity, walking, marching and hiking: Secondary | ICD-10-CM | POA: Insufficient documentation

## 2016-02-09 DIAGNOSIS — Y998 Other external cause status: Secondary | ICD-10-CM | POA: Insufficient documentation

## 2016-02-09 DIAGNOSIS — W108XXA Fall (on) (from) other stairs and steps, initial encounter: Secondary | ICD-10-CM | POA: Insufficient documentation

## 2016-02-09 DIAGNOSIS — Z791 Long term (current) use of non-steroidal anti-inflammatories (NSAID): Secondary | ICD-10-CM | POA: Insufficient documentation

## 2016-02-09 DIAGNOSIS — F17210 Nicotine dependence, cigarettes, uncomplicated: Secondary | ICD-10-CM | POA: Insufficient documentation

## 2016-02-09 DIAGNOSIS — IMO0002 Reserved for concepts with insufficient information to code with codable children: Secondary | ICD-10-CM

## 2016-02-09 DIAGNOSIS — Z23 Encounter for immunization: Secondary | ICD-10-CM | POA: Insufficient documentation

## 2016-02-09 DIAGNOSIS — S91111A Laceration without foreign body of right great toe without damage to nail, initial encounter: Secondary | ICD-10-CM | POA: Insufficient documentation

## 2016-02-09 DIAGNOSIS — Y9289 Other specified places as the place of occurrence of the external cause: Secondary | ICD-10-CM | POA: Insufficient documentation

## 2016-02-09 MED ORDER — CEPHALEXIN 500 MG PO CAPS: 500.0000 mg | ORAL_CAPSULE | Freq: Two times a day (BID) | ORAL | Status: DC

## 2016-02-09 MED ORDER — TETANUS-DIPHTH-ACELL PERTUSSIS 5-2.5-18.5 LF-MCG/0.5 IM SUSP
0.5000 mL | Freq: Once | INTRAMUSCULAR | Status: AC
  Administered 2016-02-09: 0.5 mL via INTRAMUSCULAR
  Filled 2016-02-09: qty 0.5

## 2016-02-09 MED ORDER — LIDOCAINE HCL (PF) 1 % IJ SOLN
5.0000 mL | Freq: Once | INTRAMUSCULAR | Status: AC
  Administered 2016-02-09: 5 mL
  Filled 2016-02-09: qty 5

## 2016-02-09 MED ORDER — OXYCODONE HCL 5 MG PO TABS: 5.0000 mg | ORAL_TABLET | Freq: Four times a day (QID) | ORAL | Status: DC | PRN

## 2016-02-09 NOTE — ED Notes (Signed)
PTAR from home, reports mechanical fall with lac to Right great toe, no LOC or other injuries, A/O X4 and in NAD

## 2016-02-09 NOTE — ED Provider Notes (Signed)
CSN: [id number]     Arrival date & time 02/09/16  0746 History   First MD Initiated Contact with Patient 02/09/16 0757     Chief Complaint  Patient presents with  . Laceration   (Consider location/radiation/quality/duration/timing/severity/associated sxs/prior Treatment) HPI 50 y.o. male presents to the Emergency Department today complaining of right great toe pain. Notes that this morning he was walking down the stairs and slipped. States that he caught himself on the railings, but his is toe on the step. No LOC. No head trauma. Laceration noted underneath right great toe. It was bleeding, but controlled now. Notes pain is 8/10. Throbbing. Able to walk. Pt states he washed the wound well in the bathtub, and decided to come to the ED due to the laceration. No N/V/D. No fevers. No numbness/tingling. Pt not on blood thinners. No other symptoms noted.   History reviewed. No pertinent past medical history. Past Surgical History  Procedure Laterality Date  . Mandible fracture surgery     No family history on file. Social History  Substance Use Topics  . Smoking status: Current Every Day Smoker  . Smokeless tobacco: None  . Alcohol Use: Yes    Review of Systems ROS reviewed and all are negative for acute change except as noted in the HPI.  Allergies  Vicodin  Home Medications   Prior to Admission medications   Medication Sig Start Date End Date Taking? Authorizing Provider  methocarbamol (ROBAXIN) 500 MG tablet Take 1 tablet (500 mg total) by mouth 2 (two) times daily as needed for muscle spasms. 10/18/14   Everlene FarrierWilliam Dansie, PA-C  naproxen (NAPROSYN) 500 MG tablet Take 1 tablet (500 mg total) by mouth 2 (two) times daily with a meal. 10/18/14   Everlene FarrierWilliam Dansie, PA-C  oxyCODONE-acetaminophen (PERCOCET/ROXICET) 5-325 MG per tablet Take 1-2 tablets by mouth every 4 (four) hours as needed for moderate pain or severe pain. 10/12/14   Trixie DredgeEmily West, PA-C   BP 132/81 mmHg  Pulse 86  Temp(Src)  97.6 F (36.4 C) (Oral)  Resp 20  SpO2 100%   Physical Exam  Constitutional: He is oriented to person, place, and time. He appears well-developed and well-nourished.  HENT:  Head: Normocephalic and atraumatic.  Eyes: EOM are normal. Pupils are equal, round, and reactive to light.  Neck: Normal range of motion. Neck supple.  Cardiovascular: Normal rate and regular rhythm.   Pulmonary/Chest: Effort normal.  Abdominal: Soft.  Musculoskeletal: Normal range of motion.  8cm Laceration noted underneath PIP of right great toe. Bleeding controlled. Noted decrease ROM. Cap refill <2sec. Distal pulses intact. Motor/sensation intact.   Neurological: He is alert and oriented to person, place, and time.  Skin: Skin is warm and dry.  Psychiatric: He has a normal mood and affect. His behavior is normal. Thought content normal.  Nursing note and vitals reviewed.  ED Course  .Marland Kitchen.Laceration Repair Date/Time: 02/09/2016 9:51 AM Performed by: Audry PiliMOHR, Marce Schartz Authorized by: Audry PiliMOHR, Shanigua Gibb Consent: Verbal consent obtained. Risks and benefits: risks, benefits and alternatives were discussed Consent given by: patient Patient understanding: patient states understanding of the procedure being performed Patient consent: the patient's understanding of the procedure matches consent given Procedure consent: procedure consent matches procedure scheduled Relevant documents: relevant documents present and verified Patient identity confirmed: verbally with patient and arm band Body area: lower extremity Location details: right big toe Laceration length: 8 cm Foreign bodies: no foreign bodies Tendon involvement: none Nerve involvement: none Vascular damage: no Anesthesia: local infiltration Local anesthetic: lidocaine 1%  without epinephrine Anesthetic total: 4 ml Irrigation solution: saline Irrigation method: jet lavage Amount of cleaning: extensive Debridement: minimal Degree of undermining: minimal Skin  closure: 3-0 Prolene Number of sutures: 13 Technique: simple Approximation: close Approximation difficulty: simple Dressing: 4x4 sterile gauze   (including critical care time) Labs Review Labs Reviewed - No data to display  Imaging Review Dg Toe Great Right  02/09/2016  CLINICAL DATA:  Status post fall down stairs this morning with a laceration about the right great toe. Initial encounter. EXAM: RIGHT GREAT TOE COMPARISON:  None. FINDINGS: No bony or joint abnormality is identified. No radiopaque foreign body is seen. The patient's laceration is not definitely visualized. IMPRESSION: No acute abnormality.  Negative for foreign body. Electronically Signed   By: Drusilla Kanner M.D.   On: 02/09/2016 09:02   I have personally reviewed and evaluated these images and lab results as part of my medical decision-making.   EKG Interpretation None      MDM  I have reviewed and evaluated the relevant imaging studies. I have reviewed the relevant previous healthcare records. I obtained HPI from historian.  ED Course:  Assessment:Patient is a 50yM that presents with 5cm laceration to plantar aspect of right great toe. Distal pulses intact. Cap refill intact. Decrease ROM noted. Motor/sensory intact. XR showed no acute fractures. Tdap booster given.Pressure irrigation performed. Laceration occurred < 8 hours prior to repair which was well tolerated. Pt has no co morbidities to effect normal wound healing. Discussed suture home care w pt and answered questions. Pt to f-u for wound check and suture removal in 12-14 days. DC with ABX. Pt is hemodynamically stable w no complaints prior to dc.    Disposition/Plan:  DC Home Additional Verbal discharge instructions given and discussed with patient.  Pt Instructed to f/u with PCP in the next 12-14 days for evaluation and treatment of symptoms as well as suture removal. Return precautions given Pt acknowledges and agrees with plan  Supervising  Physician Lavera Guise, MD   Final diagnoses:  Laceration       Audry Pili, PA-C 02/09/16 0954  Audry Pili, PA-C 02/09/16 1610  Lavera Guise, MD 02/09/16 1725

## 2016-02-09 NOTE — ED Notes (Signed)
Patient states he woke up early this morning to go the bathroom still asleep and he fell down some steps and sustained a laceration to the right great toe. Bleeding is controlled at this time. Patient has not had tetanus shot in several years >10.

## 2016-02-09 NOTE — ED Notes (Signed)
Bed: WA14 Expected date:  Expected time:  Means of arrival:  Comments: EMS 

## 2016-02-09 NOTE — Discharge Instructions (Signed)
Please read and follow all provided instructions.  Your diagnoses today include:  1. Laceration    Tests performed today include:  X-ray of the affected area that did not show any foreign bodies or broken bones  Vital signs. See below for your results today.   Medications prescribed:   Take any prescribed medications only as directed.  You have been prescribed a narcotic medication on an "as needed" basis. Take only as prescribed. Do not drive, operate any machinery or make any important decisions while taking this medication as it is sedating. It may cause constipation take over the counter stool softeners or add fiber to your diet to treat this (Metamucil, Psyllium Fiber, Colace, Miralax) Further refills will need to be obtained from your primary care doctor and will not be prescribed through the Emergency Department. You will test positive on most drug tests while taking this medciation.     Home care instructions:  Follow any educational materials and wound care instructions contained in this packet.   You may shower and wash the area with soap and water, just be sure to pat the area dry and not rub over the stitches. Do no put your stiches underwater (in a bath, pool, or lake). Getting stiches wet can slow down healing and increase your chances of getting an infection. You may apply Bacitracin or Neosporin twice a day for 7 days, and keep the ara clean with  bandage or gauze. Do not apply alcohol or hydrogen peroxide. Cover the area if it draining or weeping.   Follow-up instructions: Suture Removal: Return to the Emergency Department or see your primary care care doctor in 12-14 days for a recheck of your wound and removal of your sutures or staples.    Return instructions:  Return to the Emergency Department if you have:  Fever  Worsening pain  Worsening swelling of the wound  Pus draining from the wound  Redness of the skin that moves away from the wound, especially if  it streaks away from the affected area   Any other emergent concerns  Your vital signs today were: BP 132/81 mmHg   Pulse 86   Temp(Src) 97.6 F (36.4 C) (Oral)   Resp 20   SpO2 100% If your blood pressure (BP) was elevated above 135/85 this visit, please have this repeated by your doctor within one month. --------------

## 2016-07-24 ENCOUNTER — Emergency Department (HOSPITAL_COMMUNITY)
Admission: EM | Admit: 2016-07-24 | Discharge: 2016-07-25 | Disposition: A | Discharge status: Home / Self Care | Payer: Medicaid - Out of State | Attending: Emergency Medicine | Admitting: Emergency Medicine

## 2016-07-24 ENCOUNTER — Encounter (HOSPITAL_COMMUNITY): Payer: Self-pay

## 2016-07-24 DIAGNOSIS — T465X1A Poisoning by other antihypertensive drugs, accidental (unintentional), initial encounter: Secondary | ICD-10-CM | POA: Insufficient documentation

## 2016-07-24 DIAGNOSIS — E876 Hypokalemia: Secondary | ICD-10-CM | POA: Insufficient documentation

## 2016-07-24 DIAGNOSIS — F17210 Nicotine dependence, cigarettes, uncomplicated: Secondary | ICD-10-CM | POA: Insufficient documentation

## 2016-07-24 DIAGNOSIS — S51812A Laceration without foreign body of left forearm, initial encounter: Secondary | ICD-10-CM | POA: Insufficient documentation

## 2016-07-24 DIAGNOSIS — Y9389 Activity, other specified: Secondary | ICD-10-CM | POA: Insufficient documentation

## 2016-07-24 DIAGNOSIS — F1290 Cannabis use, unspecified, uncomplicated: Secondary | ICD-10-CM | POA: Insufficient documentation

## 2016-07-24 DIAGNOSIS — Y999 Unspecified external cause status: Secondary | ICD-10-CM | POA: Insufficient documentation

## 2016-07-24 DIAGNOSIS — F1410 Cocaine abuse, uncomplicated: Secondary | ICD-10-CM | POA: Insufficient documentation

## 2016-07-24 DIAGNOSIS — Z5181 Encounter for therapeutic drug level monitoring: Secondary | ICD-10-CM | POA: Insufficient documentation

## 2016-07-24 DIAGNOSIS — X789XXA Intentional self-harm by unspecified sharp object, initial encounter: Secondary | ICD-10-CM | POA: Insufficient documentation

## 2016-07-24 DIAGNOSIS — Y92009 Unspecified place in unspecified non-institutional (private) residence as the place of occurrence of the external cause: Secondary | ICD-10-CM | POA: Insufficient documentation

## 2016-07-24 HISTORY — DX: Depression, unspecified: F32.A

## 2016-07-24 HISTORY — DX: Major depressive disorder, single episode, unspecified: F32.9

## 2016-07-24 LAB — COMPREHENSIVE METABOLIC PANEL
ALK PHOS: 69 U/L (ref 38–126)
ALT: 28 U/L (ref 17–63)
AST: 17 U/L (ref 15–41)
Albumin: 4.8 g/dL (ref 3.5–5.0)
Anion gap: 7 (ref 5–15)
BUN: 8 mg/dL (ref 6–20)
CHLORIDE: 103 mmol/L (ref 101–111)
CO2: 28 mmol/L (ref 22–32)
Calcium: 9 mg/dL (ref 8.9–10.3)
Creatinine, Ser: 0.83 mg/dL (ref 0.61–1.24)
Glucose, Bld: 99 mg/dL (ref 65–99)
POTASSIUM: 3.2 mmol/L — AB (ref 3.5–5.1)
Sodium: 138 mmol/L (ref 135–145)
Total Bilirubin: 0.6 mg/dL (ref 0.3–1.2)
Total Protein: 7.8 g/dL (ref 6.5–8.1)

## 2016-07-24 LAB — CBC
HCT: 42.2 % (ref 39.0–52.0)
Hemoglobin: 14.8 g/dL (ref 13.0–17.0)
MCH: 31.4 pg (ref 26.0–34.0)
MCHC: 35.1 g/dL (ref 30.0–36.0)
MCV: 89.6 fL (ref 78.0–100.0)
PLATELETS: 192 10*3/uL (ref 150–400)
RBC: 4.71 MIL/uL (ref 4.22–5.81)
RDW: 13.7 % (ref 11.5–15.5)
WBC: 7.9 10*3/uL (ref 4.0–10.5)

## 2016-07-24 LAB — ETHANOL

## 2016-07-24 LAB — RAPID URINE DRUG SCREEN, HOSP PERFORMED
Amphetamines: NOT DETECTED
BENZODIAZEPINES: NOT DETECTED
Barbiturates: NOT DETECTED
Cocaine: POSITIVE — AB
OPIATES: NOT DETECTED
Tetrahydrocannabinol: POSITIVE — AB

## 2016-07-24 LAB — ACETAMINOPHEN LEVEL: Acetaminophen (Tylenol), Serum: <10 ug/mL — ABNORMAL LOW (ref 10–30)

## 2016-07-24 LAB — SALICYLATE LEVEL

## 2016-07-24 MED ORDER — IBUPROFEN 200 MG PO TABS: 600.0000 mg | ORAL_TABLET | Freq: Three times a day (TID) | ORAL | Status: DC | PRN

## 2016-07-24 MED ORDER — POTASSIUM CHLORIDE CRYS ER 20 MEQ PO TBCR
40.0000 meq | EXTENDED_RELEASE_TABLET | Freq: Once | ORAL | Status: AC
  Administered 2016-07-24: 40 meq via ORAL
  Filled 2016-07-24: qty 2

## 2016-07-24 MED ORDER — BACITRACIN ZINC 500 UNIT/GM EX OINT
TOPICAL_OINTMENT | Freq: Two times a day (BID) | CUTANEOUS | Status: DC
  Administered 2016-07-24: 1 via TOPICAL
  Filled 2016-07-24: qty 0.9

## 2016-07-24 MED ORDER — LORAZEPAM 1 MG PO TABS: 1.0000 mg | ORAL_TABLET | Freq: Three times a day (TID) | ORAL | Status: DC | PRN

## 2016-07-24 MED ORDER — ONDANSETRON HCL 4 MG PO TABS: 4.0000 mg | ORAL_TABLET | Freq: Three times a day (TID) | ORAL | Status: DC | PRN

## 2016-07-24 NOTE — BH Assessment (Addendum)
Tele Assessment Note   Edward Mcbride is an 50 y.o. male who presents voluntarily unaccompanied reporting a suicide attempt earlier today. Prior to ED visit, pt sts he ingested about 18 BP pills that he bought "off the street." Pt admits it was a suicide attempt. Pt also had superficial lacerations on his left wrist and forearm. Pt admitted that he had intentionally cut himself for the first time yesterday.  He did not say that it was a suicide attempt. Pt has a history of depression.  Pt reports symptoms of depression include deep sadness, fatigue, excessive guilt, decreased self esteem, tearfulness & crying spells, self isolation, lack of motivation for activities and pleasure, irritability, negative outlook, difficulty thinking & concentrating, feeling helpless and hopeless, sleep and eating disturbances. No collaterals were available for comment and pt sts he has no family locally for support.   Pt states current stressors include financial stresses and arguments with his godmother's niece over care for her aunt, his godmother. Pt sts he has been primary caregiver for his godmother and her niece had found fault with many of his actions over the last month. Pt sts his depression has been increasing over the last month.   Pt admits current suicidal ideation. Pt reports no past attempts. Pt denies homicidal ideation but, admits that there have been times when her has had passive thoughts of harming his godmother's niece. Pt sts he has no history of aggression or anger outbursts. Pt reports no legal history past or present.  Pt denies auditory or visual hallucinations or other psychotic symptoms. Pt reports he is not prescribed any psychiatric medications. Pt currently sees no one for OPT.   Pt sts he moved to [place] from [place] two years ago. Pt sts he has no family supports locally. Pt reports completing school through the 11th grade. Pt reports there is no family history of suicide or psychiatric conditions. Pt  has fair insight and impaired judgment. Pt's memory seems intact . Pt reports no history of physical, verbal, emotional or sexual abuse. Pt reports sleeping 2-3 hours each night and eating regularly and but having a decreased appetite.  Pt sts he has lost about 25-30 pounds in the last few months. ? Pt's treatment history includes OP treatment by a provider in [place] for a short time beginning when he was 50 yo following hospitalization. IP history includes 1 psychiatric hospitalization at a facility in [place] when he was 50 yo.Pt sts this admission was primarily for rehab.   Pt admits alcohol/recreational substance use including current use of cannabis daily, nicotine (cigarettes) daily and alcohol about once a month. Pt sts he used cocaine yesterday for the first time in 17 yrs.  Pt's BAL was <5 and UDS was clear for all substances when tested in the ED today.  ? MSE: Pt is dressed in scrubs. Pt seems depressed . Pt was oriented x4 with normal speech and normal motor behavior. Eye contact is good. Pt's mood is stated as depressed and affect appears depressed.  Affect seems congruent with mood. Thought process is coherent and relevant. There is no indication pt is currently responding to internal stimuli or experiencing delusional thought content. Pt was clam and cooperative  throughout assessment. Pt is currently not able to contract for safety outside the hospital.    Diagnosis: MDD, Severe; Cannabis Use D/O  Past Medical History:  Past Medical History:  Diagnosis Date  . Depression     Past Surgical History:  Procedure Laterality Date  .  MANDIBLE FRACTURE SURGERY      Family History: History reviewed. No pertinent family history.  Social History:  reports that he has been smoking Cigarettes.  He has been smoking about 1.00 pack per day. He has never used smokeless tobacco. He reports that he drinks alcohol. He reports that he uses drugs, including Marijuana, about 7 times per week.  Additional  Social History:  Alcohol / Drug Use Prescriptions: see MAR History of alcohol / drug use?: Yes Longest period of sobriety (when/how long): unknown Substance #1 Name of Substance 1: Alcohol 1 - Age of First Use: 31-12 yo 1 - Amount (size/oz): 1 beer 1 - Frequency: 1 x month 1 - Duration: ongoing 1 - Last Use / Amount: several weeks ago Substance #2 Name of Substance 2: Nicotine/Cigarettes 2 - Age of First Use: 23s 2 - Amount (size/oz): 1 pack 2 - Frequency: daily 2 - Duration: ongoing 2 - Last Use / Amount: 07/24/16 Substance #3 Name of Substance 3: Cocaine 3 - Age of First Use: 50yo 3 - Amount (size/oz): unknown 3 - Frequency: had 1st time (yesterday) in 17 yrs 3 - Duration: relapse yesterday 3 - Last Use / Amount: yesterday Substance #4 Name of Substance 4: Marijuana 4 - Age of First Use: 50 yo 4 - Amount (size/oz): 1 gram 4 - Frequency: daily 4 - Duration: ongoing 4 - Last Use / Amount: yesterday  CIWA: CIWA-Ar BP: 108/63 Pulse Rate: 62 COWS:    PATIENT STRENGTHS: (choose at least two) Average or above average intelligence Capable of independent living Communication skills  Allergies:  Allergies  Allergen Reactions  . Vicodin [Hydrocodone-Acetaminophen] Hives    Home Medications:  (Not in a hospital admission)  OB/GYN Status:  No LMP for male patient.  General Assessment Data [facility] Assessment: In system Is this a Tele or Face-to-Face Assessment?: Tele Assessment Is this an Initial Assessment or a Re-assessment for this encounter?: Initial Assessment Marital status: Divorced Living Arrangements: Other (Comment) (Primary caregive for his godmother) Can pt return to current living arrangement?:  (uncertain) Admission Status: Voluntary Is patient capable of signing voluntary admission?: Yes Referral Source: Self/Family/Friend Insurance type:  (Medicaid)  Medical Screening Exam [facility] Walk-in ONLY) Medical Exam completed:  Yes  Crisis Care Plan Living Arrangements: Other (Comment) (Primary caregive for his godmother) Legal Guardian:  (self) Name of Psychiatrist:  (none) Name of Therapist:  (none)  Education Status Is patient currently in school?: No Current Grade:  (na) Highest grade of school patient has completed:  (11th) Name of school:  (na) Contact person:  (na)  Risk to self with the past 6 months Suicidal Ideation: Yes-Currently Present (suicide attempt today) Has patient been a risk to self within the past 6 months prior to admission? : Yes (sts SI for 1 month) Suicidal Intent: Yes-Currently Present Has patient had any suicidal intent within the past 6 months prior to admission? : No Is patient at risk for suicide?: Yes Suicidal Plan?: Yes-Currently Present Has patient had any suicidal plan within the past 6 months prior to admission? : No Specify Current Suicidal Plan:  (pt attempted suicide today by ingesting 17-18 BP pills) Access to Means: Yes Specify Access to Suicidal Means:  (bought BP meds "on the street") What has been your use of drugs/alcohol within the last 12 months?:  (regualr use of cannabis & nicotine) Previous Attempts/Gestures: No (denies) How many times?:  (0) Other Self Harm Risks:  (cutting) Triggers for Past Attempts: Other  personal contacts Intentional Self Injurious Behavior: Cutting (pt sts he cut himself first time yesterday) Family Suicide History: No Recent stressful life event(s): Conflict (Comment), Financial Problems (conflict w godmother's niece over caregiving ) Persecutory voices/beliefs?: No Depression: Yes Depression Symptoms: Insomnia, Tearfulness, Isolating, Fatigue, Guilt, Loss of interest in usual pleasures, Feeling worthless/self pity, Feeling angry/irritable Substance abuse history and/or treatment for substance abuse?: Yes Suicide prevention information given to non-admitted patients: Not applicable  Risk to Others within the past 6  months Homicidal Ideation: No (denies) Does patient have any lifetime risk of violence toward others beyond the six months prior to admission? : No (denies) Thoughts of Harm to Others: Yes-Currently Present (sts had passive thoughts of harming godmother's niece) Comment - Thoughts of Harm to Others:  (sts has had passive thoughts for the last few weeks) Current Homicidal Intent: No Current Homicidal Plan: No Access to Homicidal Means: No (sts no access to guns, weapons) Identified Victim:  (thougths of harming godmother's niece) History of harm to others?: No (denies) Assessment of Violence: None Noted Violent Behavior Description:  (na) Does patient have access to weapons?: No Criminal Charges Pending?: No (denies legal hx past or present) Does patient have a court date: No Is patient on probation?: No  Psychosis Hallucinations: None noted (denies past or present) Delusions: None noted  Mental Status Report Appearance/Hygiene: Unremarkable, In scrubs Eye Contact: Good Motor Activity: Freedom of movement Speech: Unremarkable (a slight stutter) Level of Consciousness: Alert Mood: Depressed Affect: Depressed Anxiety Level: None Thought Processes: Coherent, Relevant Judgement: Impaired Orientation: Person, Place, Time, Situation Obsessive Compulsive Thoughts/Behaviors: None  Cognitive Functioning Concentration: Decreased Memory: Recent Intact, Remote Intact IQ: Average Insight: Fair Impulse Control: Poor Appetite: Poor (sts has decreased appetite) Weight Loss:  (25-30 lbs) Weight Gain:  (0) Sleep: Decreased (2-3 hrs) Total Hours of Sleep:  (2-3 hrs) Vegetative Symptoms: None  ADLScreening [facility] Assessment Services) Patient's cognitive ability adequate to safely complete daily activities?: Yes Patient able to express need for assistance with ADLs?: Yes Independently performs ADLs?: Yes (appropriate for developmental age)  Prior Inpatient Therapy Prior Inpatient  Therapy: Yes Prior Therapy Dates:  (50 yrs old) Prior Therapy Facilty/Provider(s):  (facility in [place]- Primarily Rehab) Reason for Treatment:  (Rehab & SI)  Prior Outpatient Therapy Prior Outpatient Therapy: Yes Prior Therapy Dates:  (50 yo) Prior Therapy Facilty/Provider(s):  (provider in [place]) Reason for Treatment:  (SI) Does patient have an ACCT team?: No Does patient have Intensive In-House Services?  : No Does patient have [facility] services? : No Does patient have P4CC services?: Unknown  ADL Screening (condition at time of admission) Patient's cognitive ability adequate to safely complete daily activities?: Yes Patient able to express need for assistance with ADLs?: Yes Independently performs ADLs?: Yes (appropriate for developmental age)       Abuse/Neglect Assessment (Assessment to be complete while patient is alone) Physical Abuse: Denies Verbal Abuse: Denies Sexual Abuse: Denies Exploitation of patient/patient's resources: Denies Self-Neglect: Denies     Merchant navy officer (For Healthcare) Does patient have an advance directive?: No Would patient like information on creating an advanced directive?: No - patient declined information    Additional Information 1:1 In Past 12 Months?: No CIRT Risk: No Elopement Risk: No Does patient have medical clearance?: Yes     Disposition:  Disposition Initial Assessment Completed for this Encounter: Yes Disposition of Patient: Inpatient treatment program (Per Donell Sievert, PA) Type of inpatient treatment program: Adult (Accepted [facility] Rm [id number], Attending  [place])  Spoke with  Dr. Juleen ChinaKohut, EDP, and Julieanne MansonLatrica, RN: Advised of recommendation, romm # and accepting doctor.  Beryle FlockMary Malachai Schalk, MS, [facility], [facility] Triage Specialist [facility] Max Romano T 07/24/2016 11:06 PM

## 2016-07-24 NOTE — Progress Notes (Signed)
EDCM spoke to patient at bedside. Patient confirms he [place] a pcp or [place].  [facility], informed patient of services there and walk in times.  EDCM also provided patient with list of pcps who accept self pay patients, list of discount pharmacies and websites needymeds.org and GoodRX.com for medication assistance, phone number to inquire about the orange card, phone number to inquire about Medicaid, phone number to inquire about the Affordable Care Act, financial resources in the community such as local churches, salvation army, urban ministries, and dental assistance for uninsured patients.  Patient thankful for resources.  No further EDCM needs at this time.  Patient listed as having Medicaid out of state.  EDCM [place].  Patient is aware he has to go to DSS to reapply for Medicaid for [place].  Patient reports [place] going back to [place] when he is finished here.

## 2016-07-24 NOTE — ED Provider Notes (Signed)
Please see previous physicians note regarding patient's presenting history and physical, initial ED course, and associated medical decision making. In short this is a 50 year old male who presents with reportedly taking multiple blood pressure pills today at 10:30AM. Poison control recommending observation for 6 hours from ingestion time. 8:15PM medically cleared. Hemodynamically stable. Is appropriate for psych evaluation   Lavera Guiseana Duo Rodric Punch, MD 07/24/16 2018

## 2016-07-24 NOTE — ED Notes (Signed)
Bed: [facility] Expected date:  Expected time:  Means of arrival:  Comments: 

## 2016-07-24 NOTE — ED Provider Notes (Signed)
[facility] Provider Note   CSN: [id number] Arrival date & time: 07/24/16  1239     History   Chief Complaint Chief Complaint  Patient presents with  . Ingestion  . Suicide Attempt   Level V caveat psychiatric complaints HPI Edward Mcbride is a 50 y.o. male.Patient overdosed taking approximately 18 blood pressure pills today at 10:30 AM. He does not know what the blood pressure medication is. He is asymptomatic. He overdosed because he is depressed over funds and arguments with family members. He also lacerated his left arm due to depression and suicide attempt. No other associated symptoms. Denies lightheadedness  HPI  Past Medical History:  Diagnosis Date  . Depression     There are no active problems to display for this patient.   Past Surgical History:  Procedure Laterality Date  . MANDIBLE FRACTURE SURGERY         Home Medications    Prior to Admission medications   Medication Sig Start Date End Date Taking? Authorizing Provider  cephALEXin (KEFLEX) 500 MG capsule Take 1 capsule (500 mg total) by mouth 2 (two) times daily. Patient not taking: Reported on 07/24/2016 02/09/16   Audry Pili, PA-C  methocarbamol (ROBAXIN) 500 MG tablet Take 1 tablet (500 mg total) by mouth 2 (two) times daily as needed for muscle spasms. Patient not taking: Reported on 07/24/2016 10/18/14   Everlene Farrier, PA-C  naproxen (NAPROSYN) 500 MG tablet Take 1 tablet (500 mg total) by mouth 2 (two) times daily with a meal. Patient not taking: Reported on 07/24/2016 10/18/14   Everlene Farrier, PA-C  oxyCODONE (ROXICODONE) 5 MG immediate release tablet Take 1 tablet (5 mg total) by mouth every 6 (six) hours as needed for severe pain. Patient not taking: Reported on 07/24/2016 02/09/16   Audry Pili, PA-C  oxyCODONE-acetaminophen (PERCOCET/ROXICET) 5-325 MG per tablet Take 1-2 tablets by mouth every 4 (four) hours as needed for moderate pain or severe pain. Patient not taking: Reported on  07/24/2016 10/12/14   Trixie Dredge, PA-C    Family History History reviewed. No pertinent family history.  Social History Social History  Substance Use Topics  . Smoking status: Current Every Day Smoker    Packs/day: 1.00    Types: Cigarettes  . Smokeless tobacco: Never Used  . Alcohol use Yes    Admits to cocaine abuse last time yesterday. Denies IV drug Allergies   Vicodin [hydrocodone-acetaminophen]   Review of Systems Review of Systems  Unable to perform ROS: Psychiatric disorder  Skin: Positive for wound.       Laceration of left arm  Psychiatric/Behavioral: Positive for suicidal ideas.     Physical Exam Updated Vital Signs BP 132/93 (BP Location: Left Arm)   Pulse 70   Temp 97.7 F (36.5 C) (Oral)   Resp 16   Ht 5\' 7"  (1.702 m)   Wt 160 lb (72.6 kg)   SpO2 98%   BMI 25.06 kg/m   Physical Exam  Constitutional: He is oriented to person, place, and time. He appears well-developed and well-nourished.  HENT:  Head: Normocephalic and atraumatic.  Eyes: Conjunctivae are normal. Pupils are equal, round, and reactive to light.  Neck: Neck supple. No tracheal deviation present. No thyromegaly present.  Cardiovascular: Normal rate and regular rhythm.   No murmur heard. Pulmonary/Chest: Effort normal and breath sounds normal.  Abdominal: Soft. Bowel sounds are normal. He exhibits no distension. There is no tenderness.  Musculoskeletal: Normal range of motion. He exhibits no edema or tenderness.  Neurological: He is alert and oriented to person, place, and time. No cranial nerve deficit. Coordination normal.  Gait normal not lightheaded on standing  Skin: Skin is warm and dry. No rash noted.  2 cm superficial laceration to left forearm. No active bleeding. Clean-appearing  Psychiatric:  Depressed affect  Nursing note and vitals reviewed.    ED Treatments / Results  Labs (all labs ordered are listed, but only abnormal results are displayed) Labs Reviewed    COMPREHENSIVE METABOLIC PANEL - Abnormal; Notable for the following:       Result Value   Potassium 3.2 (*)    All other components within normal limits  ACETAMINOPHEN LEVEL - Abnormal; Notable for the following:    Acetaminophen (Tylenol), Serum <10 (*)    All other components within normal limits  ETHANOL  SALICYLATE LEVEL  CBC  URINE RAPID DRUG SCREEN, HOSP PERFORMED    EKG  EKG Interpretation  Date/Time:  Monday July 24 2016 13:02:45 EDT Ventricular Rate:  65 PR Interval:    QRS Duration: 89 QT Interval:  441 QTC Calculation: 459 R Axis:   29 Text Interpretation:  Sinus rhythm Abnormal R-wave progression, early transition Baseline wander in lead(s) V3 No old tracing to compare Confirmed by Ethelda Chick  MD, Fynlee Rowlands [phone]) on 07/24/2016 1:13:03 PM      Results for orders placed or performed during the hospital encounter of 07/24/16  Comprehensive metabolic panel  Result Value Ref Range   Sodium 138 135 - 145 mmol/L   Potassium 3.2 (L) 3.5 - 5.1 mmol/L   Chloride 103 101 - 111 mmol/L   CO2 28 22 - 32 mmol/L   Glucose, Bld 99 65 - 99 mg/dL   BUN 8 6 - 20 mg/dL   Creatinine, Ser 6.04 0.61 - 1.24 mg/dL   Calcium 9.0 8.9 - 54.0 mg/dL   Total Protein 7.8 6.5 - 8.1 g/dL   Albumin 4.8 3.5 - 5.0 g/dL   AST 17 15 - 41 U/L   ALT 28 17 - 63 U/L   Alkaline Phosphatase 69 38 - 126 U/L   Total Bilirubin 0.6 0.3 - 1.2 mg/dL   GFR calc non Af Amer >60 >60 mL/min   GFR calc Af Amer >60 >60 mL/min   Anion gap 7 5 - 15  Ethanol  Result Value Ref Range   Alcohol, Ethyl (B) <5 <5 mg/dL  Salicylate level  Result Value Ref Range   Salicylate Lvl <4.0 2.8 - 30.0 mg/dL  Acetaminophen level  Result Value Ref Range   Acetaminophen (Tylenol), Serum <10 (L) 10 - 30 ug/mL  cbc  Result Value Ref Range   WBC 7.9 4.0 - 10.5 K/uL   RBC 4.71 4.22 - 5.81 MIL/uL   Hemoglobin 14.8 13.0 - 17.0 g/dL   HCT 98.1 19.1 - 47.8 %   MCV 89.6 78.0 - 100.0 fL   MCH 31.4 26.0 - 34.0 pg   MCHC 35.1 30.0  - 36.0 g/dL   RDW 29.5 62.1 - 30.8 %   Platelets 192 150 - 400 K/uL  Rapid urine drug screen (hospital performed)  Result Value Ref Range   Opiates NONE DETECTED NONE DETECTED   Cocaine POSITIVE (A) NONE DETECTED   Benzodiazepines NONE DETECTED NONE DETECTED   Amphetamines NONE DETECTED NONE DETECTED   Tetrahydrocannabinol POSITIVE (A) NONE DETECTED   Barbiturates NONE DETECTED NONE DETECTED   No results found. Radiology No results found.  Procedures Procedures (including critical care time)  Medications Ordered in  ED Medications  potassium chloride SA (K-DUR,KLOR-CON) CR tablet 40 mEq (not administered)     Initial Impression / Assessment and Plan / ED Course  I have reviewed the triage vital signs and the nursing notes.  Pertinent labs & imaging results that were available during my care of the patient were reviewed by me and considered in my medical decision making (see chart for details). Laceration at left forearm does not require repair. [facility] Suggest observe patient in ED for 6 hours.(8:15 PM) at that point if hemodynamically stable he is clear for psychiatric evaluation Clinical Course    Pt signed out to Dr. Verdie MosherLiu 4pm  Final Clinical Impressions(s) / ED Diagnoses   Final diagnoses:  None  Dx #1 suicide attempt #2 drug overdose #3laceration of left forearm #4 cocaine abuse #5 hypokalemia New Prescriptions New Prescriptions   No medications on file     Doug SouSam Trevaughn Schear, MD 07/24/16 1623

## 2016-07-24 NOTE — ED Notes (Signed)
Report called to RN Toby, [phone].  Pending [facility] transport.

## 2016-07-24 NOTE — ED Notes (Signed)
Presents for evaluation after taking approximately 18 blood pressure pills, names unknown.  Pt reports he overdosed because he is depressed over funds and arguments with family members.  Self inflicted lac to left arm due to depression and SI.  Awake,alert & responsive no distress noted. Calm & cooperative at present.  Monitoring for safety, Q 15 min checks in effect.

## 2016-07-24 NOTE — ED Triage Notes (Addendum)
Per EMS, Pt, from home, presents after SI attempt.  Pt reports taking 17-18 unknown pills around 1030.  Sts he bought them off the street.  Pt believes they were HTN medications.  Superficial marks noted to L wrist and forearm.     Upon assessment, Pt reports that he has been under a lot of stress and misses his family.  Sts SI x "2-3 weeks."  Denies HI/AVH.  Hx of depression.

## 2016-07-25 ENCOUNTER — Inpatient Hospital Stay (HOSPITAL_COMMUNITY)
Admission: AD | Admit: 2016-07-25 | Discharge: 2016-07-31 | DRG: 881 | Disposition: A | Discharge status: Home / Self Care | Payer: Federal, State, Local not specified - Other | Source: Intra-hospital | Attending: Psychiatry | Admitting: Psychiatry

## 2016-07-25 ENCOUNTER — Encounter (HOSPITAL_COMMUNITY): Payer: Self-pay

## 2016-07-25 DIAGNOSIS — F419 Anxiety disorder, unspecified: Secondary | ICD-10-CM | POA: Diagnosis present

## 2016-07-25 DIAGNOSIS — F1994 Other psychoactive substance use, unspecified with psychoactive substance-induced mood disorder: Secondary | ICD-10-CM | POA: Diagnosis not present

## 2016-07-25 DIAGNOSIS — F329 Major depressive disorder, single episode, unspecified: Principal | ICD-10-CM | POA: Diagnosis present

## 2016-07-25 DIAGNOSIS — R45851 Suicidal ideations: Secondary | ICD-10-CM | POA: Diagnosis not present

## 2016-07-25 DIAGNOSIS — F1420 Cocaine dependence, uncomplicated: Secondary | ICD-10-CM | POA: Clinically undetermined

## 2016-07-25 DIAGNOSIS — G4700 Insomnia, unspecified: Secondary | ICD-10-CM | POA: Diagnosis present

## 2016-07-25 DIAGNOSIS — F1210 Cannabis abuse, uncomplicated: Secondary | ICD-10-CM | POA: Diagnosis present

## 2016-07-25 DIAGNOSIS — F1410 Cocaine abuse, uncomplicated: Secondary | ICD-10-CM | POA: Diagnosis present

## 2016-07-25 DIAGNOSIS — F17210 Nicotine dependence, cigarettes, uncomplicated: Secondary | ICD-10-CM | POA: Diagnosis present

## 2016-07-25 DIAGNOSIS — F1220 Cannabis dependence, uncomplicated: Secondary | ICD-10-CM | POA: Diagnosis present

## 2016-07-25 DIAGNOSIS — Z79899 Other long term (current) drug therapy: Secondary | ICD-10-CM | POA: Diagnosis not present

## 2016-07-25 LAB — BASIC METABOLIC PANEL
ANION GAP: 7 (ref 5–15)
BUN: 11 mg/dL (ref 6–20)
CALCIUM: 9.2 mg/dL (ref 8.9–10.3)
CO2: 25 mmol/L (ref 22–32)
Chloride: 107 mmol/L (ref 101–111)
Creatinine, Ser: 0.96 mg/dL (ref 0.61–1.24)
Glucose, Bld: 85 mg/dL (ref 65–99)
Potassium: 3.2 mmol/L — ABNORMAL LOW (ref 3.5–5.1)
SODIUM: 139 mmol/L (ref 135–145)

## 2016-07-25 MED ORDER — SERTRALINE HCL 25 MG PO TABS
25.0000 mg | ORAL_TABLET | Freq: Every day | ORAL | Status: DC
  Administered 2016-07-25 – 2016-07-27 (×3): 25 mg via ORAL
  Filled 2016-07-25 (×5): qty 1

## 2016-07-25 MED ORDER — HYDROXYZINE HCL 25 MG PO TABS
25.0000 mg | ORAL_TABLET | Freq: Four times a day (QID) | ORAL | Status: DC | PRN
  Administered 2016-07-26 – 2016-07-30 (×5): 25 mg via ORAL
  Filled 2016-07-25: qty 10
  Filled 2016-07-25 (×5): qty 1

## 2016-07-25 MED ORDER — IBUPROFEN 600 MG PO TABS
600.0000 mg | ORAL_TABLET | Freq: Four times a day (QID) | ORAL | Status: DC | PRN
  Administered 2016-07-30: 600 mg via ORAL
  Filled 2016-07-25: qty 1

## 2016-07-25 MED ORDER — MAGNESIUM HYDROXIDE 400 MG/5ML PO SUSP: 30.0000 mL | Freq: Every day | ORAL | Status: DC | PRN

## 2016-07-25 MED ORDER — ALUM & MAG HYDROXIDE-SIMETH 200-200-20 MG/5ML PO SUSP: 30.0000 mL | ORAL | Status: DC | PRN

## 2016-07-25 MED ORDER — TRAZODONE HCL 50 MG PO TABS
50.0000 mg | ORAL_TABLET | Freq: Every evening | ORAL | Status: DC | PRN
  Filled 2016-07-25 (×6): qty 1

## 2016-07-25 NOTE — [facility] Suicide Risk Assessment (Signed)
[facility]   Nursing information obtained from:  Patient Demographic factors:  Male, Living alone, Unemployed Current Mental Status:  NA Loss Factors:  Financial problems / change in socioeconomic status, Loss of significant relationship Historical Factors:  Prior suicide attempts Risk Reduction Factors:  NA  Total Time spent with patient: 20 minutes Principal Problem: <principal problem not specified> Diagnosis:   Patient Active Problem List   Diagnosis Date Noted  . Substance induced mood disorder [facility](HCC) [F19.94] 07/25/2016   Subjective Data:  He states that he had been feeling depressed and have SI for "years." He moved from [place] to [place] to take care of his god mother since [id number]. He had argument with this god mother two weeks ago. He lost two jobs due to lack of transportation. He overdosed pressure pills to kill himself. He states that he has never done suicide before. He had thought of hanging himself but had a call from his mother who called other people to check in with him. He feels glad to be alive.   He denies alcohol use. He admits using marijuana but reports he uses cocaine "for the first time" prior to admission. UDS positive for cocaine, THC.   Continued Clinical Symptoms:  Alcohol Use Disorder Identification Test Final Score (AUDIT): 1 The "Alcohol Use Disorders Identification Test", Guidelines for Use in Primary Care, Second Edition.  World Science writerHealth Organization [facility](WHO). Score between 0-7:  no or low risk or alcohol related problems. Score between 8-15:  moderate risk of alcohol related problems. Score between 16-19:  high risk of alcohol related problems. Score 20 or above:  warrants further diagnostic evaluation for alcohol dependence and treatment.   CLINICAL FACTORS:   Depression:   Hopelessness   Musculoskeletal: Strength & Muscle Tone: within normal limits Gait & Station: normal Patient leans: N/A  Psychiatric Specialty Exam: Physical  Exam  Constitutional: He is oriented to person, place, and time. He appears well-developed and well-nourished.  Neurological: He is alert and oriented to person, place, and time.  No tremors  Skin: Skin is warm and dry.    Review of Systems  Psychiatric/Behavioral: Positive for depression. Negative for hallucinations, substance abuse and suicidal ideas. The patient is not nervous/anxious and does not have insomnia.   All other systems reviewed and are negative.   Blood pressure 94/70, pulse 71, temperature 98.6 F (37 C), temperature source Oral, resp. rate 16, height 5\' 7"  (1.702 m), weight 160 lb (72.6 kg), SpO2 100 %.Body mass index is 25.06 kg/m.  General Appearance: Fairly Groomed  Eye Contact:  Fair  Speech:  Clear and Coherent  Volume:  Normal  Mood:  Depressed  Affect:  Restricted  Thought Process:  Coherent  Orientation:  Full (Time, Place, and Person)  Thought Content:  Logical  Suicidal Thoughts:  No  Homicidal Thoughts:  No  Memory:  intact  Judgement:  Poor  Insight:  Present  Psychomotor Activity:  Normal  Concentration:  Concentration: Fair and Attention Span: Fair  Recall:  [employer]  Fund of Knowledge:  Fair  Language:  Fair  Akathisia:  No  Handed:  Right  AIMS (if indicated):     Assets:  Communication Skills Desire for Improvement  ADL's:  Intact  Cognition:  WNL  Sleep:  Number of Hours: 3.75      COGNITIVE FEATURES THAT CONTRIBUTE TO RISK:  None    SUICIDE RISK:   Mild:  Suicidal ideation of limited frequency, intensity, duration, and specificity.  There  are no identifiable plans, no associated intent, mild dysphoria and related symptoms, good self-control (both objective and subjective assessment), few other risk factors, and identifiable protective factors, including available and accessible social support.   PLAN OF CARE: Patient will be admitted to inpatient psychiatric unit for stabilization and safety. Will provide and encourage milieu  participation. Provide medication management and make adjustments as needed.  Will follow daily.   I certify that inpatient services furnished can reasonably be expected to improve the patient's condition.  [name], MD 07/25/2016, 3:47 PM

## 2016-07-25 NOTE — Progress Notes (Signed)
[facility] Group Notes:  (Nursing/MHT/Case Management/Adjunct)  Date:  07/25/2016  Time:  10:58 PM  Type of Therapy:  Psychoeducational Skills  Participation Level:  Minimal  Participation Quality:  Appropriate  Affect:  Appropriate  Cognitive:  Appropriate  Insight:  Improving  Engagement in Group:  Engaged  Modes of Intervention:  Discussion  Summary of Progress/Problems: client uses social skills to cope. Client shared that he plans on going back to [place] upon discharge where he has a support system. Client shared how terrible he felt after relapse: lose of money, depressed after using.   Lyndee HensenGoins, Brentin Shin R 07/25/2016, 10:58 PM

## 2016-07-25 NOTE — BHH Group Notes (Signed)
Patient attended Nursing group focused on recovery.  Patient participated fully in groups.  Patient was able to share coping skills that helped him maintain his sobriety during his 17 year recovery period.

## 2016-07-25 NOTE — Progress Notes (Signed)
Admission Note  Pt is a 50 y/o AA male admitted onto the [address] [facility] adult unit. Pt at the time of admission endorses moderate depression and stress; states, "I lost my job and lots of personal issues that I rather not talk about now." Pt at this time denies SI; states, "I took to those pills but I also called 911 when I realized what I have done; I ne help." Pt also denied anxiety, AVH and HI. Pt stated goals are "Need help with my depression" and "Need help handling stress better." Support, encouragement, and safe environment provided.  15-minute safety checks initiated and continued. Pt remained calm and cooperative through the admission process.

## 2016-07-25 NOTE — H&P (Signed)
Psychiatric Admission Assessment Adult  Patient Identification: Edward Mcbride  MRN:  [id number]  Date of Evaluation:  07/25/2016  Chief Complaint:  MDD SEVERE  Principal Diagnosis: Substance induced mood disorder.  Diagnosis:   Patient Active Problem List   Diagnosis Date Noted  . Substance induced mood disorder [facility]) [F19.94] 07/25/2016   History of Present Illness: This is an admission assessment for Edward Mcbride, a 50 year old African-American male. Admitted to the [facility] adult unit from the [facility] ED with complaints of suicide attempt by overdose on 20 tablets of blood pressure medications & self-inflicted laceration to left arm. During this assessment, he reports, "I called 911 yesterday because I was feeling very depressed. I have always been depressed. However, this is the first time that I felt the worst of it. A lot of stuff is going on in my life. I miss my entire family. They are in [place] here, in [place]. 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 But, we had an argument 2 weeks ago, so I left her house, then got very depressed. I have never taken any medication for depression, but will look into taking depression medicine when I finally get to [place]. I will leave to go to [place] after I get discharged from this hospital. I used crack Cocaine last Sunday, my very first time ever. I smoke weed everyday because it is helping to uplift my depression. I really do not know what you all will do for me until I talk to my Education officer, museum or counselor".  Associated Signs/Symptoms:  Depression Symptoms:  depressed mood, insomnia, hopelessness,  (Hypo) Manic Symptoms:  Admits to occasional mood swings  Anxiety Symptoms:  Excessive Worry,  Psychotic Symptoms:  Denies any hallucinations, delusional thoughts or paranoia.  PTSD Symptoms: Denies any hx of pTSD or symptoms.  Total Time spent with patient: 1 hour  Past Psychiatric  History: Major depression off & on.  Is the patient at risk to self? No.  Has the patient been a risk to self in the past 6 months? Yes.    Has the patient been a risk to self within the distant past? No.  Is the patient a risk to others? No.  Has the patient been a risk to others in the past 6 months? No.  Has the patient been a risk to others within the distant past? No.   Prior Inpatient Therapy: Patient denies. Prior Outpatient Therapy: Patient denies.  Alcohol Screening: 1. How often do you have a drink containing alcohol?: Monthly or less 2. How many drinks containing alcohol do you have on a typical day when you are drinking?: 1 or 2 3. How often do you have six or more drinks on one occasion?: Never Preliminary Score: 0 4. How often during the last year have you found that you were not able to stop drinking once you had started?: Never 5. How often during the last year have you failed to do what was normally expected from you becasue of drinking?: Never 6. How often during the last year have you needed a first drink in the morning to get yourself going after a heavy drinking session?: Never 7. How often during the last year have you had a feeling of guilt of remorse after drinking?: Never 8. How often during the last year have you been unable to remember what happened the night before because you had been drinking?: Never 9.  Have you or someone else been injured as a result of your drinking?: No 10. Has a relative or friend or a doctor or another health worker been concerned about your drinking or suggested you cut down?: No Alcohol Use Disorder Identification Test Final Score (AUDIT): 1 Brief Intervention: AUDIT score less than 7 or less-screening does not suggest unhealthy drinking-brief intervention not indicated Substance Abuse History in the last 12 months:  Yes.    Consequences of Substance Abuse: Medical Consequences:  Liver damage, Possible death by overdose Legal  Consequences:  Arrests, jail time, Loss of driving privilege. Family Consequences:  Family discord, divorce and or separation.  Previous Psychotropic Medications: No   Psychological Evaluations: Yes   Past Medical History:  Past Medical History:  Diagnosis Date  . Depression     Past Surgical History:  Procedure Laterality Date  . MANDIBLE FRACTURE SURGERY     Family History: History reviewed. No pertinent family history.  Family Psychiatric  History: Denies any family hx of mental or medical illness.  Tobacco Screening: Have you used any form of tobacco in the last 30 days? (Cigarettes, Smokeless Tobacco, Cigars, and/or Pipes): Yes Tobacco use, Select all that apply: 5 or more cigarettes per day Are you interested in Tobacco Cessation Medications?: No, patient refused Counseled patient on smoking cessation including recognizing danger situations, developing coping skills and basic information about quitting provided: Refused/Declined practical counseling  Social History:  History  Alcohol Use  . Yes     History  Drug Use  . Frequency: 7.0 times per week  . Types: Marijuana, Cocaine    Additional Social History:  Allergies:   Allergies  Allergen Reactions  . Vicodin [Hydrocodone-Acetaminophen] Hives   Lab Results:  Results for orders placed or performed during the hospital encounter of 07/25/16 (from the past 48 hour(s))  Basic metabolic panel     Status: Abnormal   Collection Time: 07/25/16  6:29 AM  Result Value Ref Range   Sodium 139 135 - 145 mmol/L   Potassium 3.2 (L) 3.5 - 5.1 mmol/L   Chloride 107 101 - 111 mmol/L   CO2 25 22 - 32 mmol/L   Glucose, Bld 85 65 - 99 mg/dL   BUN 11 6 - 20 mg/dL   Creatinine, Ser 0.96 0.61 - 1.24 mg/dL   Calcium 9.2 8.9 - 10.3 mg/dL   GFR calc non Af Amer >60 >60 mL/min   GFR calc Af Amer >60 >60 mL/min    Comment: (NOTE) The eGFR has been calculated using the CKD EPI equation. This calculation has not been validated in  all clinical situations. eGFR's persistently <60 mL/min signify possible Chronic Kidney Disease.    Anion gap 7 5 - 15    Comment: Performed at [facility]   Blood Alcohol level:  Lab Results  Component Value Date   [facility] <5 03/55/9741   Metabolic Disorder Labs:  No results found for: HGBA1C, MPG No results found for: PROLACTIN No results found for: CHOL, TRIG, HDL, CHOLHDL, VLDL, LDLCALC  Current Medications: Current Facility-Administered Medications  Medication Dose Route Frequency Provider Last Rate Last Dose  . alum & mag hydroxide-simeth (MAALOX/MYLANTA) 200-200-20 MG/5ML suspension 30 mL  30 mL Oral Q4H PRN Laverle Hobby, PA-C      . hydrOXYzine (ATARAX/VISTARIL) tablet 25 mg  25 mg Oral Q6H PRN Laverle Hobby, PA-C      . ibuprofen (ADVIL,MOTRIN) tablet 600 mg  600 mg Oral Q6H PRN Laverle Hobby,  PA-C      . magnesium hydroxide (MILK OF MAGNESIA) suspension 30 mL  30 mL Oral Daily PRN Laverle Hobby, PA-C      . traZODone (DESYREL) tablet 50 mg  50 mg Oral QHS,MR X 1 Spencer E Simon, PA-C       PTA Medications: Prescriptions Prior to Admission  Medication Sig Dispense Refill Last Dose  . cephALEXin (KEFLEX) 500 MG capsule Take 1 capsule (500 mg total) by mouth 2 (two) times daily. (Patient not taking: Reported on 07/24/2016) 14 capsule 0 Unknown at Unknown time  . methocarbamol (ROBAXIN) 500 MG tablet Take 1 tablet (500 mg total) by mouth 2 (two) times daily as needed for muscle spasms. (Patient not taking: Reported on 07/24/2016) 20 tablet 0 Unknown at Unknown time  . naproxen (NAPROSYN) 500 MG tablet Take 1 tablet (500 mg total) by mouth 2 (two) times daily with a meal. (Patient not taking: Reported on 07/24/2016) 30 tablet 0 Unknown at Unknown time  . oxyCODONE (ROXICODONE) 5 MG immediate release tablet Take 1 tablet (5 mg total) by mouth every 6 (six) hours as needed for severe pain. (Patient not taking: Reported on 07/24/2016) 5 tablet 0 Unknown at  Unknown time  . oxyCODONE-acetaminophen (PERCOCET/ROXICET) 5-325 MG per tablet Take 1-2 tablets by mouth every 4 (four) hours as needed for moderate pain or severe pain. (Patient not taking: Reported on 07/24/2016) 10 tablet 0 Unknown at Unknown time   Musculoskeletal: Strength & Muscle Tone: within normal limits Gait & Station: normal Patient leans: N/A  Psychiatric Specialty Exam: Physical Exam  Constitutional: He is oriented to person, place, and time. He appears well-developed and well-nourished.  HENT:  Head: Normocephalic.  Eyes: Pupils are equal, round, and reactive to light.  Neck: Normal range of motion.  Cardiovascular: Normal rate.   Respiratory: Effort normal.  GI: Soft.  Genitourinary:  Genitourinary Comments: Denies any issues in this area   Musculoskeletal: Normal range of motion.  Neurological: He is alert and oriented to person, place, and time.  Skin: Skin is warm.  Psychiatric: His speech is normal and behavior is normal. Thought content normal. His mood appears anxious. His affect is not angry, not blunt, not labile and not inappropriate. Cognition and memory are normal. He expresses impulsivity. He exhibits a depressed mood.    Review of Systems  Constitutional: Positive for malaise/fatigue.  HENT: Negative.   Eyes: Negative.   Respiratory: Negative.   Cardiovascular: Negative.   Gastrointestinal: Negative.   Genitourinary: Negative.   Musculoskeletal: Negative.   Skin: Negative.   Neurological: Negative.   Endo/Heme/Allergies: Negative.   Psychiatric/Behavioral: Positive for depression and substance abuse (Hx Cocaine & THC dependence). Negative for hallucinations, memory loss and suicidal ideas. The patient is nervous/anxious and has insomnia.     Blood pressure 94/70, pulse 71, temperature 98.6 F (37 C), temperature source Oral, resp. rate 16, height 5' 7"  (1.702 m), weight 72.6 kg (160 lb), SpO2 100 %.Body mass index is 25.06 kg/m.  General  Appearance: Disheveled  Eye Contact:  Fair  Speech:  Clear and Coherent  Volume:  Normal  Mood:  Depressed, denies any anxiety symptoms.  Affect:  Restricted  Thought Process:  Coherent  Orientation:  Full (Time, Place, and Person)  Thought Content:  Denies any hallucinations, delusional thoughts or paranoia.  Suicidal Thoughts:  Yes, "It comes & goes" Hx suicide attempt by overdose yeaterday.  Homicidal Thoughts:  Denies any thoughts, plans or intent  Memory:  Immediate;  Good Recent;   Good Remote;   Good  Judgement:  Fair  Insight:  Fair  Psychomotor Activity:  Normal  Concentration:  Concentration: Fair and Attention Span: Fair  Recall:  Good  Fund of Knowledge:  Fair  Language:  Good  Akathisia:  Negative  Handed:  Right  AIMS (if indicated):     Assets:  Communication Skills Desire for Improvement Physical Health  ADL's:  Intact  Cognition:  WNL  Sleep:  Number of Hours: 3.75   Treatment Plan Summary: Daily contact with patient to assess and evaluate symptoms and progress in treatment and Medication management:1. Admit for crisis management and stabilization, estimated length of stay 3-5 days.  2. Medication management to reduce current symptoms to base line and improve the patient's overall level of functioning  3. Treat health problems as indicated.  4. Develop treatment plan to decrease risk of relapse upon discharge and the need for readmission.  5. Psycho-social education regarding relapse prevention and self care.  6. Health care follow up as needed for medical problems.  7. Review, reconcile, and reinstate any pertinent home medications for other health issues where appropriate. 8. Call for consults with hospitalist for any additional specialty patient care services as needed.  Observation Level/Precautions:  15 minute checks  Laboratory:  Per ED  Psychotherapy:  Group sessions  Medications: See [facility] for Medication lists   Consultations: As needed    Discharge Concerns: Safety, mood stability  Estimated LOS: 2-4 days  Other:  Admit to [id number]   I certify that inpatient services furnished can reasonably be expected to improve the patient's condition.    Encarnacion Slates, NP, PMHNP, FNP-BC 8/29/201711:17 AM

## 2016-07-25 NOTE — Progress Notes (Signed)
Patient ID: Garth SchlatterDarryl Rowlette, male   DOB: 07/27/1966, 50 y.o.   MRN: [id number]   Report accepted from admitting nurse, Tobi, RN. Pt currently presents with a flat affect and assertive behavior. Pt supported emotionally and encouraged to express concerns and questions. Pt's safety ensured with 15 minute and environmental checks. Pt currently lying in bed, requests another blanket. Pt denies any other concerns. Will continue to monitor.

## 2016-07-25 NOTE — Tx Team (Signed)
Initial Treatment Plan 07/25/2016 4:48 AM Shubham Riley Lamouglas ZOX:[id number]    PATIENT STRESSORS: Financial difficulties Occupational concerns Substance abuse   PATIENT STRENGTHS: Capable of independent living Communication skills Physical Health Supportive family/friends   PATIENT IDENTIFIED PROBLEMS: "Need help with my depression"  "Need help handling stress better"  Depression  Substance Abuse  Risk for Suicide"             DISCHARGE CRITERIA:  Ability to meet basic life and health needs Verbal commitment to aftercare and medication compliance Withdrawal symptoms are absent or subacute and managed without 24-hour nursing intervention  PRELIMINARY DISCHARGE PLAN: Return to previous living arrangement  PATIENT/FAMILY INVOLVEMENT: This treatment plan has been presented to and reviewed with the patient, Edward SchlatterDarryl Mcbride, and/or family member.  The patient and family have been given the opportunity to ask questions and make suggestions.  Edward KetoAdediran [name], RN 07/25/2016, 4:48 AM

## 2016-07-25 NOTE — Progress Notes (Signed)
Recreation Therapy Notes   Animal-Assisted Activity (AAA) Program Checklist/Progress Notes Patient Eligibility Criteria Checklist & Daily Group note for Rec TxIntervention  Date: 08.29.2017 Time: 2:45pm Location: [address] Dayroom   AAA/T Program Assumption of Risk Form signed by Patient/ or Parent Legal Guardian Yes  Patient is free of allergies or sever asthma Yes  Patient reports no fear of animals Yes  Patient reports no history of cruelty to animals Yes  Patient understands his/her participation is voluntary Yes  Behavioral Response: Did not attend.   Teegan Brandis L Alyzae Hawkey, LRT/CTRS  Mahalia Dykes L 07/25/2016 3:00 PM 

## 2016-07-25 NOTE — Progress Notes (Signed)
Patient denies SI, HI and AVH this shift. Patient reported feeling remorseful over loosing his sobriety.  Patient stated "I used then I felt guilty about it and I did something stupid and tried to hurt myself."  Patient pleasant and compliant with therapy.  Patient plans to get back on track with his sobriety.   Assess patient for safety, offer medications as prescribed, engage patient in 1:1 staff talks.   Continue to monitor for safety, Patient able to contract for safety.

## 2016-07-25 NOTE — [facility] Group Notes (Signed)
[facility] LCSW Group Therapy 07/25/2016 1:15 PM Type of Therapy: Group Therapy Participation Level: Active  Participation Quality: Attentive, Sharing and Supportive  Affect: Depressed and Flat  Cognitive: Alert and Oriented  Insight: Developing/Improving and Engaged  Engagement in Therapy: Developing/Improving and Engaged  Modes of Intervention: Activity, Clarification, Confrontation, Discussion, Education, Exploration, Limit-setting, Orientation, Problem-solving, Rapport Building, Dance movement psychotherapisteality Testing, Socialization and Support  Summary of Progress/Problems: Patient was attentive and engaged with speaker from [employer]. Patient was attentive to speaker while they shared their story of dealing with mental health and overcoming it. Patient expressed interest in their programs and services and received information on their agency. Patient processed ways they can relate to the speaker.   Edward BruinKristin Katye Valek, LCSW Clinical Social Worker [facility] [phone]

## 2016-07-25 NOTE — Tx Team (Signed)
Interdisciplinary Treatment and Diagnostic Plan Update  07/25/2016 Time of Session: 9:30am Edward Mcbride MRN: [id number]  Principal Diagnosis: <principal problem not specified>  Secondary Diagnoses: Active Problems:   Substance induced mood disorder (HCC)   Current Medications:  Current Facility-Administered Medications  Medication Dose Route Frequency Provider Last Rate Last Dose  . alum & mag hydroxide-simeth (MAALOX/MYLANTA) 200-200-20 MG/5ML suspension 30 mL  30 mL Oral Q4H PRN Laverle Hobby, PA-C      . hydrOXYzine (ATARAX/VISTARIL) tablet 25 mg  25 mg Oral Q6H PRN Laverle Hobby, PA-C      . ibuprofen (ADVIL,MOTRIN) tablet 600 mg  600 mg Oral Q6H PRN Laverle Hobby, PA-C      . magnesium hydroxide (MILK OF MAGNESIA) suspension 30 mL  30 mL Oral Daily PRN Laverle Hobby, PA-C      . traZODone (DESYREL) tablet 50 mg  50 mg Oral QHS,MR X 1 Spencer E Simon, PA-C       PTA Medications: Prescriptions Prior to Admission  Medication Sig Dispense Refill Last Dose  . cephALEXin (KEFLEX) 500 MG capsule Take 1 capsule (500 mg total) by mouth 2 (two) times daily. (Patient not taking: Reported on 07/24/2016) 14 capsule 0 Unknown at Unknown time  . methocarbamol (ROBAXIN) 500 MG tablet Take 1 tablet (500 mg total) by mouth 2 (two) times daily as needed for muscle spasms. (Patient not taking: Reported on 07/24/2016) 20 tablet 0 Unknown at Unknown time  . naproxen (NAPROSYN) 500 MG tablet Take 1 tablet (500 mg total) by mouth 2 (two) times daily with a meal. (Patient not taking: Reported on 07/24/2016) 30 tablet 0 Unknown at Unknown time  . oxyCODONE (ROXICODONE) 5 MG immediate release tablet Take 1 tablet (5 mg total) by mouth every 6 (six) hours as needed for severe pain. (Patient not taking: Reported on 07/24/2016) 5 tablet 0 Unknown at Unknown time  . oxyCODONE-acetaminophen (PERCOCET/ROXICET) 5-325 MG per tablet Take 1-2 tablets by mouth every 4 (four) hours as needed for moderate pain or  severe pain. (Patient not taking: Reported on 07/24/2016) 10 tablet 0 Unknown at Unknown time    Treatment Modalities: Medication Management, Group therapy, Case management,  1 to 1 session with clinician, Psychoeducation, Recreational therapy.   Physician Treatment Plan for Primary Diagnosis: <principal problem not specified> Long Term Goal(s): Improvement in symptoms so as ready for discharge   Short Term Goals: Ability to identify changes in lifestyle to reduce recurrence of condition will improve, Ability to identify and develop effective coping behaviors will improve, Compliance with prescribed medications will improve and Ability to identify triggers associated with substance abuse/mental health issues will improve  Medication Management: Evaluate patient's response, side effects, and tolerance of medication regimen.  Therapeutic Interventions: 1 to 1 sessions, Unit Group sessions and Medication administration.  Evaluation of Outcomes: Not Met  Physician Treatment Plan for Secondary Diagnosis: Active Problems:   Substance induced mood disorder ([place])  Long Term Goal(s): Improvement in symptoms so as ready for discharge  Short Term Goals: Ability to identify changes in lifestyle to reduce recurrence of condition will improve, Ability to demonstrate self-control will improve, Compliance with prescribed medications will improve and Ability to identify triggers associated with substance abuse/mental health issues will improve  Medication Management: Evaluate patient's response, side effects, and tolerance of medication regimen.  Therapeutic Interventions: 1 to 1 sessions, Unit Group sessions and Medication administration.  Evaluation of Outcomes: Not Met   RN Treatment Plan for Primary Diagnosis: <principal problem not  specified> Long Term Goal(s): Knowledge of disease and therapeutic regimen to maintain health will improve  Short Term Goals: Ability to demonstrate self-control,  Ability to verbalize feelings will improve and Ability to identify and develop effective coping behaviors will improve  Medication Management: RN will administer medications as ordered by provider, will assess and evaluate patient's response and provide education to patient for prescribed medication. RN will report any adverse and/or side effects to prescribing provider.  Therapeutic Interventions: 1 on 1 counseling sessions, Psychoeducation, Medication administration, Evaluate responses to treatment, Monitor vital signs and CBGs as ordered, Perform/monitor CIWA, COWS, AIMS and Fall Risk screenings as ordered, Perform wound care treatments as ordered.  Evaluation of Outcomes: Not Met   LCSW Treatment Plan for Primary Diagnosis: <principal problem not specified> Long Term Goal(s): Safe transition to appropriate next level of care at discharge, Engage patient in therapeutic group addressing interpersonal concerns.  Short Term Goals: Engage patient in aftercare planning with referrals and resources, Increase social support, Increase emotional regulation and Increase skills for wellness and recovery  Therapeutic Interventions: Assess for all discharge needs, 1 to 1 time with Social worker, Explore available resources and support systems, Assess for adequacy in community support network, Educate family and significant other(s) on suicide prevention, Complete Psychosocial Assessment, Interpersonal group therapy.  Evaluation of Outcomes: Not Met   Progress in Treatment :  Attending groups: Continuing to assess  Participating in groups: Continuing to assess  Taking medication as prescribed: Yes, MD continuing to assess for appropriate medication regimen  Toleration medication: Yes  Family/Significant other contact made: Treatment team assessing for appropriate contacts  Patient understands diagnosis: Yes  Discussing patient identified problems/goals with staff: Yes  Medical problems  stabilized or resolved: Yes  Denies suicidal/homicidal ideation: Treatment team continuing to asses  Issues/concerns per patient self-inventory: None reported  Other: N/A  New problem(s) identified: None reported at this time    New Short Term/Long Term Goal(s): None at this time    Discharge Plan or Barriers: Treatment team continuing to assess.    Reason for Continuation of Hospitalization: Anxiety Depression Hallucinations Mania Medication stabilization Suicidal ideation Withdrawal symptoms  Estimated Length of Stay: 3-5 days    Attendees:  Patient:   Physician: Dr. Modesta Messing, MD 8/29/20179:30am  Nursing: Edward Mcbride, Edward Mcbride, RN8/29/20179:30am  RN Care Manager: Edward Mcbride, CM 07/25/16  Social Workers: Edward Fossa, LCSW 8/29/20179:30am  Nurse Pratictioners:   Scribe for Treatment Team: Edward Mcbride, [place] Worker [facility] [phone]

## 2016-07-26 NOTE — Progress Notes (Signed)
DAR NOTE: Pt present with flat affect and depressed mood in the unit. Pt has been observed in the dayroom interacting with peers. Pt reported this morning feeling tired due to lack of sleep. Pt did ask for medications,but still did not fall a sleep. Pt denies physical pain, took all his meds as scheduled. As per self inventory, pt had a poor  night sleep, fair appetite, low energy, and poor concentration. Pt rate depression at 6, hopeless ness at 5, and anxiety at 4. Pt's safety ensured with 15 minute and environmental checks. Pt currently denies SI/HI and A/V hallucinations. Pt verbally agrees to seek staff if SI/HI or A/VH occurs and to consult with staff before acting on these thoughts. Will continue POC.
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
## 2016-07-26 NOTE — Progress Notes (Signed)
Recreation Therapy Notes  Date: 07/26/16 Time: 0930 Location: 300 Hall Dayroom  Group Topic: Stress Management  Goal Area(s) Addresses:  Patient will verbalize importance of using healthy stress management.  Patient will identify positive emotions associated with healthy stress management.   Behavioral Response: Engaged  Intervention: Stress Management   Activity :  Progressive Muscle Relaxation.  LRT introduced to technique of progressive muscle relaxation to patients.  LRT read a script to guide the patients through the exercise.  Patients were to follow along as LRT read the script to engage in the activity.  Education:  Stress Management, Discharge Planning.   Education Outcome: Acknowledges edcuation/In group clarification offered/Needs additional education  Clinical Observations/Feedback:  Pt attended group.    Caroll RancherMarjette Meggie Laseter, LRT/CTRS    Caroll RancherLindsay, Seraj Dunnam A 07/26/2016 2:54 PM

## 2016-07-26 NOTE — [facility] Group Notes (Signed)
[facility] LCSW Group Therapy 07/26/2016  1:15 PM   Type of Therapy: Group Therapy  Participation Level: Did Not Attend. Patient invited to participate but declined.   Ailey Wessling, MSW, LCSW Clinical Social Worker [facility] [phone]   

## 2016-07-26 NOTE — Progress Notes (Signed)
[facility] MD Progress Note  07/26/2016 3:58 PM Edward Mcbride  MRN:  [id number] Subjective:   He states that he had insomnia last night. He has been "thinking a lot," which includes about his disposition plans. He continues to feel depressed, but denies SI/HI.   Principal Problem: Substance induced mood disorder ([place]) Diagnosis:   Patient Active Problem List   Diagnosis Date Noted  . Substance induced mood disorder ([place]) [F19.94] 07/25/2016   Total Time spent with patient: 15 minutes  Past Psychiatric History: see HPI  Past Medical History:  Past Medical History:  Diagnosis Date  . Depression     Past Surgical History:  Procedure Laterality Date  . MANDIBLE FRACTURE SURGERY     Family History: History reviewed. No pertinent family history. Family Psychiatric  History: see HPI Social History:  History  Alcohol Use  . Yes     History  Drug Use  . Frequency: 7.0 times per week  . Types: Marijuana, Cocaine    Social History   Social History  . Marital status: Single    Spouse name: N/A  . Number of children: N/A  . Years of education: N/A   Social History Main Topics  . Smoking status: Current Every Day Smoker    Packs/day: 1.00    Types: Cigarettes  . Smokeless tobacco: Never Used  . Alcohol use Yes  . Drug use:     Frequency: 7.0 times per week    Types: Marijuana, Cocaine  . Sexual activity: Not Asked   Other Topics Concern  . None   Social History Narrative  . None   Additional Social History:                         Sleep: Poor  Appetite:  Fair  Current Medications: Current Facility-Administered Medications  Medication Dose Route Frequency Provider Last Rate Last Dose  . alum & mag hydroxide-simeth (MAALOX/MYLANTA) 200-200-20 MG/5ML suspension 30 mL  30 mL Oral Q4H PRN Laverle Hobby, PA-C      . hydrOXYzine (ATARAX/VISTARIL) tablet 25 mg  25 mg Oral Q6H PRN Laverle Hobby, PA-C      . ibuprofen (ADVIL,MOTRIN) tablet 600 mg  600 mg Oral  Q6H PRN Laverle Hobby, PA-C      . magnesium hydroxide (MILK OF MAGNESIA) suspension 30 mL  30 mL Oral Daily PRN Laverle Hobby, PA-C      . sertraline (ZOLOFT) tablet 25 mg  25 mg Oral Daily Norman Clay, MD   25 mg at 07/26/16 6283    Lab Results:  Results for orders placed or performed during the hospital encounter of 07/25/16 (from the past 48 hour(s))  Basic metabolic panel     Status: Abnormal   Collection Time: 07/25/16  6:29 AM  Result Value Ref Range   Sodium 139 135 - 145 mmol/L   Potassium 3.2 (L) 3.5 - 5.1 mmol/L   Chloride 107 101 - 111 mmol/L   CO2 25 22 - 32 mmol/L   Glucose, Bld 85 65 - 99 mg/dL   BUN 11 6 - 20 mg/dL   Creatinine, Ser 0.96 0.61 - 1.24 mg/dL   Calcium 9.2 8.9 - 10.3 mg/dL   GFR calc non Af Amer >60 >60 mL/min   GFR calc Af Amer >60 >60 mL/min    Comment: (NOTE) The eGFR has been calculated using the CKD EPI equation. This calculation has not been validated in all clinical situations. eGFR's  persistently <60 mL/min signify possible Chronic Kidney Disease.    Anion gap 7 5 - 15    Comment: Performed at [facility]    Blood Alcohol level:  Lab Results  Component Value Date   [facility] <5 17/00/1749    Metabolic Disorder Labs: No results found for: HGBA1C, MPG No results found for: PROLACTIN No results found for: CHOL, TRIG, HDL, CHOLHDL, VLDL, LDLCALC  Physical Findings: AIMS: Facial and Oral Movements Muscles of Facial Expression: None, normal Lips and Perioral Area: None, normal Jaw: None, normal Tongue: None, normal,Extremity Movements Upper (arms, wrists, hands, fingers): None, normal Lower (legs, knees, ankles, toes): None, normal, Trunk Movements Neck, shoulders, hips: None, normal, Overall Severity Severity of abnormal movements (highest score from questions above): None, normal Incapacitation due to abnormal movements: None, normal Patient's awareness of abnormal movements (rate only patient's report): No  Awareness, Dental Status Current problems with teeth and/or dentures?: No Does patient usually wear dentures?: No  CIWA:    COWS:     Musculoskeletal: Strength & Muscle Tone: within normal limits Gait & Station: normal Patient leans: N/A  Psychiatric Specialty Exam: Physical Exam  Review of Systems  Psychiatric/Behavioral: Positive for depression. Negative for hallucinations, substance abuse and suicidal ideas. The patient is nervous/anxious and has insomnia.   All other systems reviewed and are negative.   Blood pressure 131/89, pulse 75, temperature 97.9 F (36.6 C), temperature source Oral, resp. rate 18, height 5' 7"  (1.702 m), weight 160 lb (72.6 kg), SpO2 100 %.Body mass index is 25.06 kg/m.  General Appearance: Fairly Groomed  Eye Contact:  Fair  Speech:  Clear and Coherent  Volume:  Normal  Mood:  Depressed  Affect:  Restricted  Thought Process:  Coherent and Goal Directed  Orientation:  Full (Time, Place, and Person)  Thought Content:  Logical Perceptions: denies AH/VH  Suicidal Thoughts:  No  Homicidal Thoughts:  No  Memory:  intact  Judgement:  Fair  Insight:  Present  Psychomotor Activity:  Normal  Concentration:  Concentration: Fair and Attention Span: Fair  Recall:  [employer] of Knowledge:  Fair  Language:  Fair  Akathisia:  No  Handed:  Right  AIMS (if indicated):     Assets:  Communication Skills Desire for Improvement  ADL's:  Intact  Cognition:  WNL  Sleep:  Number of Hours: 6.75   Assessment 50 year old male admitted after overdosing 20 tabs of blood pressure medication in the setting of cocaine use for the first time, and discordance with his god mother.   # Substance induced mood disorder # r/o MDD Patient continues to endorse depressed mood although he is future oriented and denies SI. Will continue current medication.   Plans - Continue sertraline 25 mg daily - Obtain labs; TSH, repeat BMP to monitor K - - Admit for crisis management  and stabilization. - Medication management to reduce current symptoms to base line and improve the patient's overall level of functioning. - Monitor for the adverse effect of the medications and anger outbursts - Continue 15 minutes observation for safety concerns - Encouraged to participate in milieu therapy and group therapy counseling sessions and also work with coping skills -  Develop treatment plan to decrease risk of relapse upon discharge and to reduce the need for readmission. -  Psycho-social education regarding relapse prevention and self care. - Health care follow up as needed for medical problems. - Restart home medications where appropriate.  Treatment Plan Summary: Daily  contact with patient to assess and evaluate symptoms and progress in treatment  Norman Clay, MD 07/26/2016, 3:58 PM

## 2016-07-27 LAB — BASIC METABOLIC PANEL
Anion gap: 6 (ref 5–15)
BUN: 12 mg/dL (ref 6–20)
CO2: 26 mmol/L (ref 22–32)
Calcium: 8.8 mg/dL — ABNORMAL LOW (ref 8.9–10.3)
Chloride: 108 mmol/L (ref 101–111)
Creatinine, Ser: 0.88 mg/dL (ref 0.61–1.24)
Glucose, Bld: 86 mg/dL (ref 65–99)
POTASSIUM: 3.6 mmol/L (ref 3.5–5.1)
SODIUM: 140 mmol/L (ref 135–145)

## 2016-07-27 LAB — TSH: TSH: 1.604 u[IU]/mL (ref 0.350–4.500)

## 2016-07-27 MED ORDER — MIRTAZAPINE 15 MG PO TABS
15.0000 mg | ORAL_TABLET | Freq: Every day | ORAL | Status: DC
  Administered 2016-07-27 – 2016-07-30 (×4): 15 mg via ORAL
  Filled 2016-07-27 (×5): qty 1

## 2016-07-27 MED ORDER — MIRTAZAPINE 7.5 MG PO TABS: 7.5000 mg | ORAL_TABLET | Freq: Every day | ORAL | Status: DC

## 2016-07-27 NOTE — Progress Notes (Addendum)
Patient ID: Edward Mcbride, male   DOB: August 09, 1966, 50 y.o.   MRN: [id number] [facility] MD Progress Note  07/27/2016 2:21 PM Edward Mcbride  MRN:  [id number] Subjective:   Patient seen, chart reviewed and case discussed with nursing staff.   He states that he feels "ok" but feels he needs to do more socialization with people. He did go to lounge earlier and had some conversation with others instead of staying in the room. He feels that he "cannot believe" that he tried to kill himself. He states that although he has had chronic SI, he went too far to kill himself and feels ashamed of it. He endorses insomnia last night.  Principal Problem: Substance induced mood disorder ([place]) Diagnosis:   Patient Active Problem List   Diagnosis Date Noted  . Substance induced mood disorder ([place]) [F19.94] 07/25/2016   Total Time spent with patient: 15 minutes  Past Psychiatric History: see HPI  Past Medical History:  Past Medical History:  Diagnosis Date  . Depression     Past Surgical History:  Procedure Laterality Date  . MANDIBLE FRACTURE SURGERY     Family History: History reviewed. No pertinent family history. Family Psychiatric  History: see HPI Social History:  History  Alcohol Use  . Yes     History  Drug Use  . Frequency: 7.0 times per week  . Types: Marijuana, Cocaine    Social History   Social History  . Marital status: Single    Spouse name: N/A  . Number of children: N/A  . Years of education: N/A   Social History Main Topics  . Smoking status: Current Every Day Smoker    Packs/day: 1.00    Types: Cigarettes  . Smokeless tobacco: Never Used  . Alcohol use Yes  . Drug use:     Frequency: 7.0 times per week    Types: Marijuana, Cocaine  . Sexual activity: Not Asked   Other Topics Concern  . None   Social History Narrative  . None   Additional Social History:                         Sleep: Poor  Appetite:  Fair  Current Medications: Current  Facility-Administered Medications  Medication Dose Route Frequency Provider Last Rate Last Dose  . alum & mag hydroxide-simeth (MAALOX/MYLANTA) 200-200-20 MG/5ML suspension 30 mL  30 mL Oral Q4H PRN Edward Hobby, PA-C      . hydrOXYzine (ATARAX/VISTARIL) tablet 25 mg  25 mg Oral Q6H PRN Edward Hobby, PA-C   25 mg at 07/26/16 2121  . ibuprofen (ADVIL,MOTRIN) tablet 600 mg  600 mg Oral Q6H PRN Edward Hobby, PA-C      . magnesium hydroxide (MILK OF MAGNESIA) suspension 30 mL  30 mL Oral Daily PRN Edward Hobby, PA-C      . sertraline (ZOLOFT) tablet 25 mg  25 mg Oral Daily Edward Clay, MD   25 mg at 07/27/16 8185    Lab Results:  Results for orders placed or performed during the hospital encounter of 07/25/16 (from the past 48 hour(s))  Basic metabolic panel     Status: Abnormal   Collection Time: 07/27/16  6:27 AM  Result Value Ref Range   Sodium 140 135 - 145 mmol/L   Potassium 3.6 3.5 - 5.1 mmol/L   Chloride 108 101 - 111 mmol/L   CO2 26 22 - 32 mmol/L   Glucose, Bld 86 65 -  99 mg/dL   BUN 12 6 - 20 mg/dL   Creatinine, Ser 0.88 0.61 - 1.24 mg/dL   Calcium 8.8 (L) 8.9 - 10.3 mg/dL   GFR calc non Af Amer >60 >60 mL/min   GFR calc Af Amer >60 >60 mL/min    Comment: (NOTE) The eGFR has been calculated using the CKD EPI equation. This calculation has not been validated in all clinical situations. eGFR's persistently <60 mL/min signify possible Chronic Kidney Disease.    Anion gap 6 5 - 15    Comment: Performed at [facility]  TSH     Status: None   Collection Time: 07/27/16  6:27 AM  Result Value Ref Range   TSH 1.604 0.350 - 4.500 uIU/mL    Comment: Performed at [facility]    Blood Alcohol level:  Lab Results  Component Value Date   [facility] <5 66/04/3015    Metabolic Disorder Labs: No results found for: HGBA1C, MPG No results found for: PROLACTIN No results found for: CHOL, TRIG, HDL, CHOLHDL, VLDL, LDLCALC  Physical  Findings: AIMS: Facial and Oral Movements Muscles of Facial Expression: None, normal Lips and Perioral Area: None, normal Jaw: None, normal Tongue: None, normal,Extremity Movements Upper (arms, wrists, hands, fingers): None, normal Lower (legs, knees, ankles, toes): None, normal, Trunk Movements Neck, shoulders, hips: None, normal, Overall Severity Severity of abnormal movements (highest score from questions above): None, normal Incapacitation due to abnormal movements: None, normal Patient's awareness of abnormal movements (rate only patient's report): No Awareness, Dental Status Current problems with teeth and/or dentures?: No Does patient usually wear dentures?: No  CIWA:  CIWA-Ar Total: 1 COWS:  COWS Total Score: 1  Musculoskeletal: Strength & Muscle Tone: within normal limits Gait & Station: normal Patient leans: N/A  Psychiatric Specialty Exam: Physical Exam  Review of Systems  Psychiatric/Behavioral: Positive for depression. Negative for hallucinations, substance abuse and suicidal ideas. The patient is nervous/anxious and has insomnia.   All other systems reviewed and are negative.   Blood pressure 125/87, pulse 79, temperature 98.5 F (36.9 C), temperature source Oral, resp. rate 18, height 5' 7"  (1.702 m), weight 160 lb (72.6 kg), SpO2 100 %.Body mass index is 25.06 kg/m.  General Appearance: Fairly Groomed  Eye Contact:  Fair  Speech:  Clear and Coherent  Volume:  Normal  Mood:  Depressed  Affect:  Restricted- improving  Thought Process:  Coherent and Goal Directed  Orientation:  Full (Time, Place, and Person)  Thought Content:  Logical Perceptions: denies AH/VH  Suicidal Thoughts:  No  Homicidal Thoughts:  No  Memory:  intact  Judgement:  Fair  Insight:  Present  Psychomotor Activity:  Normal  Concentration:  Concentration: Fair and Attention Span: Fair  Recall:  [employer] of Knowledge:  Fair  Language:  Fair  Akathisia:  No  Handed:  Right  AIMS (if  indicated):     Assets:  Communication Skills Desire for Improvement  ADL's:  Intact  Cognition:  WNL  Sleep:  Number of Hours: 6.75   Assessment 50 year old male admitted after overdosing 20 tabs of blood pressure medication in the setting of cocaine use for the first time, and discordance with his god mother.   # MDD # r/o Substance induced mood disorder There has been an improvement in his neurovegetative symptoms but continues to endorse insomnia. Will switch from sertraline to mirtazapine to target his mood/insomnia.   Plans - Discontinue sertraline - Start mirtazapine 15  mg qhs - Reviewed labs; TSH 1.60, K normalized  - - Admit for crisis management and stabilization. - Medication management to reduce current symptoms to base line and improve the patient's overall level of functioning. - Monitor for the adverse effect of the medications and anger outbursts - Continue 15 minutes observation for safety concerns - Encouraged to participate in milieu therapy and group therapy counseling sessions and also work with coping skills -  Develop treatment plan to decrease risk of relapse upon discharge and to reduce the need for readmission. -  Psycho-social education regarding relapse prevention and self care. - Health care follow up as needed for medical problems. - Restart home medications where appropriate.  Treatment Plan Summary: Daily contact with patient to assess and evaluate symptoms and progress in treatment  Edward Clay, MD 07/27/2016, 2:21 PM

## 2016-07-27 NOTE — Progress Notes (Signed)
D:  Patient's self inventory sheet, patient has poor sleep, sleep medication is not helpful.  Fair appetite, low energy level, good concentration.  Rate depression 7, hopeless and anxiety 6.  Denied withdrawals.  Denied SI.  Denied physical problems.  Pain medication is not working.  "Do know" for staff.  No discharge plans. A:  Medications administered per MD orders.  Emotional support and encouragement given patient. R:  Denied SI and HI, contracts for safety.  Denied A/V hallucinations.  Safety maintained with 15 minute checks.

## 2016-07-27 NOTE — Progress Notes (Signed)
D    Pt is depressed and sad   Pt is cooperative with treatment he interacts minimally but appropriately with his peers   Pt reported his day was ok A    Verbal support given   Medications administered and effectiveness monitored    Q 15 min checks  R    Pt safe at present

## 2016-07-27 NOTE — [facility] Group Notes (Signed)
[facility] LCSW Group Therapy 07/27/2016  1:15 pm   Type of Therapy: Group Therapy Participation Level: Minimal  Participation Quality: Attentive  Affect: Blunted  Cognitive: Alert and Oriented  Insight: Developing/Improving and Engaged  Engagement in Therapy: Developing/Improving and Engaged  Modes of Intervention: Clarification, Confrontation, Discussion, Education, Exploration, Limit-setting, Orientation, Problem-solving, Rapport Building, Dance movement psychotherapisteality Testing, Socialization and Support  Summary of Progress/Problems: The topic for group was balance in life. Today's group focused on defining balance in one's own words, identifying things that can knock one off balance, and exploring healthy ways to maintain balance in life. Group members were asked to provide an example of a time when they felt off balance, describe how they handled that situation,and process healthier ways to regain balance in the future. Group members were asked to share the most important tool for maintaining balance that they learned [facility] and how they plan to apply this method after discharge. Patient identified loneliness and mistrust of others as barriers to wellness. He expressed his interest in getting linked up with outpatient services in [place] at discharge.   Samuella BruinKristin Danette Weinfeld, MSW, LCSW Clinical Social Worker [facility] [phone]

## 2016-07-27 NOTE — [facility] Group Notes (Signed)
The focus of this group is to educate the patient on the purpose and policies of crisis stabilization and provide a format to answer questions about their admission.  The group details unit policies and expectations of patients while admitted.  Patient attended 0900 nurse education orientation group morning.  Patient actively participated and had appropriate affect.  Patient was alert.  Patient had appropriate insight and appropriate engagement.  Today patient will work on 3 goals for discharge.

## 2016-07-27 NOTE — Plan of Care (Signed)
Problem: Education: Goal: Utilization of techniques to improve thought processes will improve Outcome: Progressing Nurse discussed depression/coping skills with patient.    

## 2016-07-28 NOTE — Progress Notes (Signed)
D.  Pt pleasant on approach, denies complaints at this time.  Positive for evening AA group, interacting appropriately with peers on the unit.  Pt denies SI/HI/hallucinations at this time.  A.  Support and encouragement offered,medications given as ordered  R. Pt remains safe on the unit, will continue to monitor.

## 2016-07-28 NOTE — [facility] Group Notes (Signed)
[facility] LCSW Group Therapy 07/28/2016 1:15 PM Type of Therapy: Group Therapy Participation Level: Active  Participation Quality: Attentive, Sharing and Supportive  Affect: Appropriate  Cognitive: Alert and Oriented  Insight: Developing/Improving and Engaged  Engagement in Therapy: Developing/Improving and Engaged  Modes of Intervention: Clarification, Confrontation, Discussion, Education, Exploration, Limit-setting, Orientation, Problem-solving, Rapport Building, Dance movement psychotherapisteality Testing, Socialization and Support  Summary of Progress/Problems: The topic for today was feelings about relapse. Pt discussed what relapse prevention is to them and identified triggers that they are on the path to relapse. Pt processed their feeling towards relapse and was able to relate to peers. Pt discussed coping skills that can be used for relapse prevention. Patient discussed his recent relapse after 17 yrs of sobriety. He states that he was triggered to use by family stressors but realizes that it only made his situation worse. Patient reports having strong family support in [place].    Edward BruinKristin Tomasz Mcbride, MSW, LCSW Clinical Social Worker [facility] [phone]

## 2016-07-28 NOTE — Progress Notes (Signed)
Patient ID: Edward Mcbride, male   DOB: 14-Jul-1966, 50 y.o.   MRN: [id number] [facility] MD Progress Note  07/28/2016 2:31 PM Edward Mcbride  MRN:  [id number] Subjective:   Patient seen, chart reviewed and case discussed with nursing staff.   He continues to endorse insomnia with night time awakening. He feels good otherwise and is planning to go back to [place]. He denies SI.   Principal Problem: Substance induced mood disorder ([place]) Diagnosis:   Patient Active Problem List   Diagnosis Date Noted  . Substance induced mood disorder ([place]) [F19.94] 07/25/2016   Total Time spent with patient: 15 minutes  Past Psychiatric History: see HPI  Past Medical History:  Past Medical History:  Diagnosis Date  . Depression     Past Surgical History:  Procedure Laterality Date  . MANDIBLE FRACTURE SURGERY     Family History: History reviewed. No pertinent family history. Family Psychiatric  History: see HPI Social History:  History  Alcohol Use  . Yes     History  Drug Use  . Frequency: 7.0 times per week  . Types: Marijuana, Cocaine    Social History   Social History  . Marital status: Single    Spouse name: N/A  . Number of children: N/A  . Years of education: N/A   Social History Main Topics  . Smoking status: Current Every Day Smoker    Packs/day: 1.00    Types: Cigarettes  . Smokeless tobacco: Never Used  . Alcohol use Yes  . Drug use:     Frequency: 7.0 times per week    Types: Marijuana, Cocaine  . Sexual activity: Not Asked   Other Topics Concern  . None   Social History Narrative  . None   Additional Social History:                         Sleep: Poor  Appetite:  Fair  Current Medications: Current Facility-Administered Medications  Medication Dose Route Frequency Provider Last Rate Last Dose  . alum & mag hydroxide-simeth (MAALOX/MYLANTA) 200-200-20 MG/5ML suspension 30 mL  30 mL Oral Q4H PRN Laverle Hobby, PA-C      . hydrOXYzine (ATARAX/VISTARIL)  tablet 25 mg  25 mg Oral Q6H PRN Laverle Hobby, PA-C   25 mg at 07/27/16 2111  . ibuprofen (ADVIL,MOTRIN) tablet 600 mg  600 mg Oral Q6H PRN Laverle Hobby, PA-C      . magnesium hydroxide (MILK OF MAGNESIA) suspension 30 mL  30 mL Oral Daily PRN Laverle Hobby, PA-C      . mirtazapine (REMERON) tablet 15 mg  15 mg Oral QHS Norman Clay, MD   15 mg at 07/27/16 2111    Lab Results:  Results for orders placed or performed during the hospital encounter of 07/25/16 (from the past 48 hour(s))  Basic metabolic panel     Status: Abnormal   Collection Time: 07/27/16  6:27 AM  Result Value Ref Range   Sodium 140 135 - 145 mmol/L   Potassium 3.6 3.5 - 5.1 mmol/L   Chloride 108 101 - 111 mmol/L   CO2 26 22 - 32 mmol/L   Glucose, Bld 86 65 - 99 mg/dL   BUN 12 6 - 20 mg/dL   Creatinine, Ser 0.88 0.61 - 1.24 mg/dL   Calcium 8.8 (L) 8.9 - 10.3 mg/dL   GFR calc non Af Amer >60 >60 mL/min   GFR calc Af Amer >60 >60 mL/min  Comment: (NOTE) The eGFR has been calculated using the CKD EPI equation. This calculation has not been validated in all clinical situations. eGFR's persistently <60 mL/min signify possible Chronic Kidney Disease.    Anion gap 6 5 - 15    Comment: Performed at [facility]  TSH     Status: None   Collection Time: 07/27/16  6:27 AM  Result Value Ref Range   TSH 1.604 0.350 - 4.500 uIU/mL    Comment: Performed at [facility]    Blood Alcohol level:  Lab Results  Component Value Date   [facility] <5 46/50/3546    Metabolic Disorder Labs: No results found for: HGBA1C, MPG No results found for: PROLACTIN No results found for: CHOL, TRIG, HDL, CHOLHDL, VLDL, LDLCALC  Physical Findings: AIMS: Facial and Oral Movements Muscles of Facial Expression: None, normal Lips and Perioral Area: None, normal Jaw: None, normal Tongue: None, normal,Extremity Movements Upper (arms, wrists, hands, fingers): None, normal Lower (legs, knees, ankles,  toes): None, normal, Trunk Movements Neck, shoulders, hips: None, normal, Overall Severity Severity of abnormal movements (highest score from questions above): None, normal Incapacitation due to abnormal movements: None, normal Patient's awareness of abnormal movements (rate only patient's report): No Awareness, Dental Status Current problems with teeth and/or dentures?: No Does patient usually wear dentures?: No  CIWA:  CIWA-Ar Total: 1 COWS:  COWS Total Score: 1  Musculoskeletal: Strength & Muscle Tone: within normal limits Gait & Station: normal Patient leans: N/A  Psychiatric Specialty Exam: Physical Exam  Review of Systems  Psychiatric/Behavioral: Positive for depression. Negative for hallucinations, substance abuse and suicidal ideas. The patient is nervous/anxious and has insomnia.   All other systems reviewed and are negative.   Blood pressure (!) 130/96, pulse 73, temperature 98 F (36.7 C), temperature source Oral, resp. rate 18, height _0  (1.702 m), weight 160 lb (72.6 kg), SpO2 100 %.Body mass index is 25.06 kg/m.  General Appearance: Fairly Groomed  Eye Contact:  Fair  Speech:  Clear and Coherent  Volume:  Normal  Mood:  "good today"  Affect:  Restricted- improving  Thought Process:  Coherent and Goal Directed  Orientation:  Full (Time, Place, and Person)  Thought Content:  Logical Perceptions: denies AH/VH  Suicidal Thoughts:  No  Homicidal Thoughts:  No  Memory:  intact  Judgement:  Fair  Insight:  Fair  Psychomotor Activity:  Normal  Concentration:  Concentration: Fair and Attention Span: Fair  Recall:  [employer] of Knowledge:  Fair  Language:  Fair  Akathisia:  No  Handed:  Right  AIMS (if indicated):     Assets:  Communication Skills Desire for Improvement  ADL's:  Intact  Cognition:  WNL  Sleep:  Number of Hours: 6.75   Assessment 50 year old male admitted after overdosing 20 tabs of blood pressure medication in the setting of cocaine use  for the first time, and discordance with his god mother.   # MDD # r/o Substance induced mood disorder Although he continues to endorse insomnia, there has been an improvement in his neurovegetative symptoms. Will continue mirtazapine.   Plans - Continue mirtazapine 15 mg qhs - Reviewed labs; TSH 1.60, K normalized  - - Admit for crisis management and stabilization. - Medication management to reduce current symptoms to base line and improve the patient's overall level of functioning. - Monitor for the adverse effect of the medications and anger outbursts - Continue 15 minutes observation for safety concerns -  Encouraged to participate in milieu therapy and group therapy counseling sessions and also work with coping skills -  Develop treatment plan to decrease risk of relapse upon discharge and to reduce the need for readmission. -  Psycho-social education regarding relapse prevention and self care. - Health care follow up as needed for medical problems. - Restart home medications where appropriate.  Treatment Plan Summary: Daily contact with patient to assess and evaluate symptoms and progress in treatment  Norman Clay, MD 07/28/2016, 2:31 PM

## 2016-07-28 NOTE — Progress Notes (Signed)
Recreation Therapy Notes  Date: 07/28/16 Time: 0930 Location: 300 Hall Dayroom  Group Topic: Stress Management  Goal Area(s) Addresses:  Patient will verbalize importance of using healthy stress management.  Patient will identify positive emotions associated with healthy stress management.   Behavioral Response: Engaged  Intervention: Stress Management  Activity :  Guided Training and development officermagery Script.  LRT introduced the stress management technique guided imagery.  LRT read script that guided patients in how to perform the technique.  Patients were to follow along as LRT read the script to engage in the technique.  Education: Stress Management, Discharge Planning.   Education Outcome: Acknowledges edcuation/In group clarification offered/Needs additional education  Clinical Observations/Feedback: Pt attended group.   Caroll RancherMarjette Miah Boye, LRT/CTRS         Lillia AbedLindsay, Loralye Loberg A 07/28/2016 1:05 PM

## 2016-07-28 NOTE — Progress Notes (Signed)
D: Pt denies SI/HI/AV hallucinations. Pt is pleasant and cooperative. Pt goal for today is "come out of room and attend group". A: Pt was offered support and encouragement. Pt was given scheduled medications. Pt was encourage to attend groups. Q 15 minute checks were done for safety.  R:Pt  Did not attend group.  Pt is taking medication. Pt has no complaints.Pt receptive to treatment and safety maintained on unit.

## 2016-07-28 NOTE — Progress Notes (Signed)
D:Pt had a flat/sad affect this morning with minimal interaction. Pt has been out of his room this afternoon and interacting more attending groups. He rates depression and anxiety as a 6 on 0-10 scale with 10 being the most. Pt reports that he did not sleep well last night.  A:Offered support, encouragement and 15 minute checks. R:Pt denies si and hi. Safety maintained on the unit.

## 2016-07-28 NOTE — [facility] Group Notes (Signed)
Patient did not attend group.

## 2016-07-28 NOTE — Tx Team (Signed)
Interdisciplinary Treatment and Diagnostic Plan Update  07/28/2016 Time of Session: 9:30am Edward Mcbride MRN: [id number]  Principal Diagnosis: Substance induced mood disorder (HCC)  Secondary Diagnoses: Principal Problem:   Substance induced mood disorder (HCC)   Current Medications:  Current Facility-Administered Medications  Medication Dose Route Frequency Provider Last Rate Last Dose  . alum & mag hydroxide-simeth (MAALOX/MYLANTA) 200-200-20 MG/5ML suspension 30 mL  30 mL Oral Q4H PRN Kerry Hough, PA-C      . hydrOXYzine (ATARAX/VISTARIL) tablet 25 mg  25 mg Oral Q6H PRN Kerry Hough, PA-C   25 mg at 07/27/16 2111  . ibuprofen (ADVIL,MOTRIN) tablet 600 mg  600 mg Oral Q6H PRN Kerry Hough, PA-C      . magnesium hydroxide (MILK OF MAGNESIA) suspension 30 mL  30 mL Oral Daily PRN Kerry Hough, PA-C      . mirtazapine (REMERON) tablet 15 mg  15 mg Oral QHS [name], MD   15 mg at 07/27/16 2111   PTA Medications: Prescriptions Prior to Admission  Medication Sig Dispense Refill Last Dose  . cephALEXin (KEFLEX) 500 MG capsule Take 1 capsule (500 mg total) by mouth 2 (two) times daily. (Patient not taking: Reported on 07/24/2016) 14 capsule 0 Unknown at Unknown time  . methocarbamol (ROBAXIN) 500 MG tablet Take 1 tablet (500 mg total) by mouth 2 (two) times daily as needed for muscle spasms. (Patient not taking: Reported on 07/24/2016) 20 tablet 0 Unknown at Unknown time  . naproxen (NAPROSYN) 500 MG tablet Take 1 tablet (500 mg total) by mouth 2 (two) times daily with a meal. (Patient not taking: Reported on 07/24/2016) 30 tablet 0 Unknown at Unknown time  . oxyCODONE (ROXICODONE) 5 MG immediate release tablet Take 1 tablet (5 mg total) by mouth every 6 (six) hours as needed for severe pain. (Patient not taking: Reported on 07/24/2016) 5 tablet 0 Unknown at Unknown time  . oxyCODONE-acetaminophen (PERCOCET/ROXICET) 5-325 MG per tablet Take 1-2 tablets by mouth every 4 (four)  hours as needed for moderate pain or severe pain. (Patient not taking: Reported on 07/24/2016) 10 tablet 0 Unknown at Unknown time    Treatment Modalities: Medication Management, Group therapy, Case management,  1 to 1 session with clinician, Psychoeducation, Recreational therapy.   Physician Treatment Plan for Primary Diagnosis: Substance induced mood disorder (HCC) Long Term Goal(s): Improvement in symptoms so as ready for discharge   Short Term Goals: Ability to identify changes in lifestyle to reduce recurrence of condition will improve, Ability to identify and develop effective coping behaviors will improve, Compliance with prescribed medications will improve and Ability to identify triggers associated with substance abuse/mental health issues will improve  Medication Management: Evaluate patient's response, side effects, and tolerance of medication regimen.  Therapeutic Interventions: 1 to 1 sessions, Unit Group sessions and Medication administration.  Evaluation of Outcomes: Progressing  Physician Treatment Plan for Secondary Diagnosis: Principal Problem:   Substance induced mood disorder (HCC)  Long Term Goal(s): Improvement in symptoms so as ready for discharge  Short Term Goals: Ability to identify changes in lifestyle to reduce recurrence of condition will improve, Ability to demonstrate self-control will improve, Compliance with prescribed medications will improve and Ability to identify triggers associated with substance abuse/mental health issues will improve  Medication Management: Evaluate patient's response, side effects, and tolerance of medication regimen.  Therapeutic Interventions: 1 to 1 sessions, Unit Group sessions and Medication administration.  Evaluation of Outcomes: Progressing   RN Treatment Plan for Primary Diagnosis:  Substance induced mood disorder (HCC) Long Term Goal(s): Knowledge of disease and therapeutic regimen to maintain health will  improve  Short Term Goals: Ability to demonstrate self-control, Ability to verbalize feelings will improve and Ability to identify and develop effective coping behaviors will improve  Medication Management: RN will administer medications as ordered by provider, will assess and evaluate patient's response and provide education to patient for prescribed medication. RN will report any adverse and/or side effects to prescribing provider.  Therapeutic Interventions: 1 on 1 counseling sessions, Psychoeducation, Medication administration, Evaluate responses to treatment, Monitor vital signs and CBGs as ordered, Perform/monitor CIWA, COWS, AIMS and Fall Risk screenings as ordered, Perform wound care treatments as ordered.  Evaluation of Outcomes: Progressing   LCSW Treatment Plan for Primary Diagnosis: Substance induced mood disorder (HCC) Long Term Goal(s): Safe transition to appropriate next level of care at discharge, Engage patient in therapeutic group addressing interpersonal concerns.  Short Term Goals: Engage patient in aftercare planning with referrals and resources, Increase social support, Increase emotional regulation and Increase skills for wellness and recovery  Therapeutic Interventions: Assess for all discharge needs, 1 to 1 time with Social worker, Explore available resources and support systems, Assess for adequacy in community support network, Educate family and significant other(s) on suicide prevention, Complete Psychosocial Assessment, Interpersonal group therapy.  Evaluation of Outcomes: Progressing   Progress in Treatment :  Attending groups: Intermittently  Participating in groups: Minimally  Taking medication as prescribed: Yes, MD continuing to assess for appropriate medication regimen  Toleration medication: Yes  Family/Significant other contact made: Treatment team assessing for appropriate contacts  Patient understands diagnosis: Yes  Discussing patient  identified problems/goals with staff: Yes  Medical problems stabilized or resolved: Yes  Denies suicidal/homicidal ideation: Treatment team continuing to asses  Issues/concerns per patient self-inventory: None reported  Other: N/A  New problem(s) identified: None reported at this time    New Short Term/Long Term Goal(s): None at this time    Discharge Plan or Barriers: Patient plans to relocate to [place] to stay with family and to follow up with outpatient services.     Reason for Continuation of Hospitalization: Anxiety Depression Hallucinations Mania Medication stabilization Suicidal ideation Withdrawal symptoms  Estimated Length of Stay: 2-3 days    Attendees:  Patient:   Physician: Dr. Vanetta ShawlHisada , MD  07/28/2016   9:30am  Nursing: Lissa Hoardlivette W., Waynetta SandyJan Wright  07/28/2016 9:30am  RN Care Manager: Onnie BoerJennifer Clark, CM  07/28/2016 9:30am  Social Workers: Samuella BruinKristin Sana Tessmer, LCSW,  07/28/2016 9:30am  Nurse Pratictioners:   Scribe for Treatment Team: Samuella BruinKristin Itamar Mcgowan, LCSW Clinical Social Worker [facility] [phone]

## 2016-07-29 MED ORDER — QUETIAPINE FUMARATE 50 MG PO TABS
75.0000 mg | ORAL_TABLET | Freq: Every day | ORAL | Status: DC
  Administered 2016-07-29: 75 mg via ORAL
  Filled 2016-07-29 (×4): qty 1

## 2016-07-29 NOTE — Progress Notes (Signed)
Patient ID: Edward Mcbride, male   DOB: 08-29-1966, 50 y.o.   MRN: [id number] Patient ID: Edward Mcbride, male   DOB: [birth year], 50 y.o.   MRN: [id number] [facility] MD Progress Note  07/29/2016 4:20 PM Edward Mcbride  MRN:  [id number]  Subjective: Edward Mcbride reports, "I just can't slep at night. I just lie in my bed thinking & feeling sorry for myself. Unless I start to get some sleep at, mind isn't going to be right"   Objective: Patient seen, chart reviewed and case discussed with nursing staff. He continues to endorse insomnia with night time awakening. He feels good otherwise and is planning to go back to [place]. He denies SI, however, endorsing feeling sorry for himself. Initiated Seroquel 75 mg Q hs for insomnia.  Principal Problem: Substance induced mood disorder (HCC) Diagnosis:   Patient Active Problem List   Diagnosis Date Noted  . Substance induced mood disorder (HCC) [F19.94] 07/25/2016   Total Time spent with patient: 15 minutes  Past Psychiatric History: See HPI  Past Medical History:  Past Medical History:  Diagnosis Date  . Depression     Past Surgical History:  Procedure Laterality Date  . MANDIBLE FRACTURE SURGERY     Family History: History reviewed. No pertinent family history.  Family Psychiatric  History: See HPI  Social History:  History  Alcohol Use  . Yes     History  Drug Use  . Frequency: 7.0 times per week  . Types: Marijuana, Cocaine    Social History   Social History  . Marital status: Single    Spouse name: N/A  . Number of children: N/A  . Years of education: N/A   Social History Main Topics  . Smoking status: Current Every Day Smoker    Packs/day: 1.00    Types: Cigarettes  . Smokeless tobacco: Never Used  . Alcohol use Yes  . Drug use:     Frequency: 7.0 times per week    Types: Marijuana, Cocaine  . Sexual activity: Not Asked   Other Topics Concern  . None   Social History Narrative  . None   Additional Social History:   Sleep:  Poor, "I just can't sleep at night"  Appetite:  Fair  Current Medications: Current Facility-Administered Medications  Medication Dose Route Frequency Provider Last Rate Last Dose  . alum & mag hydroxide-simeth (MAALOX/MYLANTA) 200-200-20 MG/5ML suspension 30 mL  30 mL Oral Q4H PRN Kerry Hough, PA-C      . hydrOXYzine (ATARAX/VISTARIL) tablet 25 mg  25 mg Oral Q6H PRN Kerry Hough, PA-C   25 mg at 07/28/16 2116  . ibuprofen (ADVIL,MOTRIN) tablet 600 mg  600 mg Oral Q6H PRN Kerry Hough, PA-C      . magnesium hydroxide (MILK OF MAGNESIA) suspension 30 mL  30 mL Oral Daily PRN Kerry Hough, PA-C      . mirtazapine (REMERON) tablet 15 mg  15 mg Oral QHS [name], MD   15 mg at 07/28/16 2116    Lab Results:  No results found for this or any previous visit (from the past 48 hour(s)).  Blood Alcohol level:  Lab Results  Component Value Date   ETH <5 07/24/2016    Metabolic Disorder Labs: No results found for: HGBA1C, MPG No results found for: PROLACTIN No results found for: CHOL, TRIG, HDL, CHOLHDL, VLDL, LDLCALC  Physical Findings: AIMS: Facial and Oral Movements Muscles of Facial Expression: None, normal Lips and Perioral Area: None, normal Jaw:  None, normal Tongue: None, normal,Extremity Movements Upper (arms, wrists, hands, fingers): None, normal Lower (legs, knees, ankles, toes): None, normal, Trunk Movements Neck, shoulders, hips: None, normal, Overall Severity Severity of abnormal movements (highest score from questions above): None, normal Incapacitation due to abnormal movements: None, normal Patient's awareness of abnormal movements (rate only patient's report): No Awareness, Dental Status Current problems with teeth and/or dentures?: No Does patient usually wear dentures?: No  CIWA:  CIWA-Ar Total: 0 COWS:  COWS Total Score: 1  Musculoskeletal: Strength & Muscle Tone: within normal limits Gait & Station: normal Patient leans: N/A  Psychiatric  Specialty Exam: Physical Exam  HENT:  Head: Normocephalic.  Eyes: Pupils are equal, round, and reactive to light.  Neck: Normal range of motion.  Respiratory: Effort normal.  GI: Soft.    Review of Systems  Constitutional: Positive for malaise/fatigue.  HENT: Negative.   Eyes: Negative.   Respiratory: Negative.   Cardiovascular: Negative.   Gastrointestinal: Negative.   Genitourinary: Negative.   Skin: Negative.   Psychiatric/Behavioral: Positive for depression. Negative for hallucinations, memory loss, substance abuse and suicidal ideas. The patient is nervous/anxious and has insomnia.   All other systems reviewed and are negative.   Blood pressure 117/87, pulse 75, temperature 97.5 F (36.4 C), temperature source Oral, resp. rate 18, height 5\' 7"  (1.702 m), weight 72.6 kg (160 lb), SpO2 100 %.Body mass index is 25.06 kg/m.  General Appearance: Fairly Groomed  Eye Contact:  Fair  Speech:  Clear and Coherent  Volume:  Normal  Mood:  "good today"  Affect:  Restricted- improving  Thought Process:  Coherent and Goal Directed  Orientation:  Full (Time, Place, and Person)  Thought Content:  Logical Perceptions: denies AH/VH  Suicidal Thoughts:  No  Homicidal Thoughts:  No  Memory:  intact  Judgement:  Fair  Insight:  Fair  Psychomotor Activity:  Normal  Concentration:  Concentration: Fair and Attention Span: Fair  Recall:  [employer] of Knowledge:  Fair  Language:  Fair  Akathisia:  No  Handed:  Right  AIMS (if indicated):     Assets:  Communication Skills Desire for Improvement  ADL's:  Intact  Cognition:  WNL  Sleep:  Number of Hours: 6.75   Assessment 50 year old male admitted after overdosing 20 tabs of blood pressure medication in the setting of cocaine use for the first time, and discordance with his god mother.   # MDD # r/o Substance induced mood disorder Although he continues to endorse insomnia, there has been an improvement in his neurovegetative  symptoms. Will continue mirtazapine.   Plans - Continue mirtazapine 15 mg for depression qhs. -Initiate Seroquel 75 mg Q hs for mood control/insomnia. - Reviewed labs; TSH 1.60, K normalized  - - Admit for crisis management and stabilization. - Medication management to reduce current symptoms to base line and improve the patient's overall level of functioning. - Monitor for the adverse effect of the medications and anger outbursts - Continue 15 minutes observation for safety concerns - Encouraged to participate in milieu therapy and group therapy counseling sessions and also work with coping skills -  Develop treatment plan to decrease risk of relapse upon discharge and to reduce the need for readmission. -  Psycho-social education regarding relapse prevention and self care. - Health care follow up as needed for medical problems. - Restart home medications where appropriate.  Treatment Plan Summary: Daily contact with patient to assess and evaluate symptoms and progress in treatment  Sanjuana KavaNwoko, Agnes I, NP, PMHNP, FNP-BC. 07/29/2016, 4:20 PM

## 2016-07-29 NOTE — Progress Notes (Signed)
D.   Pt pleasant on approach, complaint continues of insomnia.  Pt was positive for evening AA group, interacting appropriately with peers on the unit.  Pt denies SI/HI/hallucinatons at this time.  A.  Support and encouragement offered, medications given as ordered for insomnia  R.  PT remains safe on unit, will continue to monitor.

## 2016-07-29 NOTE — Progress Notes (Signed)
Patient did attend the evening speaker AA meeting.  

## 2016-07-29 NOTE — [facility] Group Notes (Signed)
[facility] LCSW Group Therapy  07/29/2016 10:00 AM  Type of Therapy:  Group Therapy  Participation Level:  Did Not Attend   Aslee Such J Ryler Laskowski 07/29/2016, 

## 2016-07-30 MED ORDER — QUETIAPINE FUMARATE 100 MG PO TABS
100.0000 mg | ORAL_TABLET | Freq: Every day | ORAL | Status: DC
  Administered 2016-07-30: 100 mg via ORAL
  Filled 2016-07-30 (×2): qty 1

## 2016-07-30 NOTE — [facility] Group Notes (Signed)
Nursing psychoeducational group focussed on mindfulness meditation/body scanning technique with guided meditation.   Pt did not attend. 

## 2016-07-30 NOTE — [facility] Group Notes (Signed)
[facility] LCSW Group Therapy  07/30/2016 10:00 AM  Type of Therapy:  Group Therapy  Participation Level:  Did Not Attend  Tocarra Gassen J Carey Lafon 07/30/2016 

## 2016-07-30 NOTE — Plan of Care (Signed)
Problem: Activity: Goal: Interest or engagement in leisure activities will improve Outcome: Progressing Pt has attended AA group this evening

## 2016-07-30 NOTE — Progress Notes (Signed)
D: Patient's self inventory sheet: patient has poor sleep, recieved sleep medication.good  Appetite, normal energy level, good concentration. Rated depression 7/10, hopeless 6/10, anxiety 7/10. SI/HI/AVH: denies. Physical complaints are denied. Goal is "talking to my family". Plans to work on (not addressed).   A: Medications administered, assessed medication knowledge and education given on medication regimen.  Emotional support and encouragement given patient. R: Denies SI and HI , contracts for safety. Safety maintained with 15 minute checks.

## 2016-07-30 NOTE — Progress Notes (Signed)
Patient ID: Edward Mcbride, male   DOB: [birth year], 50 y.o.   MRN: [id number] Patient ID: Edward Mcbride, male   DOB: 05/03/1966, 50 y.o.   MRN: [id number] Patient ID: Edward Mcbride, male   DOB: [birth year], 50 y.o.   MRN: [id number] [facility] MD Progress Note  07/30/2016 1:12 PM Edward Mcbride  MRN:  [id number]  Subjective: Maanav reports, "I just can't slep at night. I slept for may be three hours. Unless I start to get some sleep at, mind isn't going to be right"   Objective: Patient seen, chart reviewed and case discussed with nursing staff. He continues to endorse insomnia with night time awakening. He feels good otherwise and is planning to go back to [place]. He denies SI, however, endorsing feeling sorry for himself. Icreased the Seroquel to 100 mg Q hs for insomnia.  Principal Problem: Substance induced mood disorder (HCC) Diagnosis:   Patient Active Problem List   Diagnosis Date Noted  . Substance induced mood disorder (HCC) [F19.94] 07/25/2016   Total Time spent with patient: 15 minutes  Past Psychiatric History: See HPI  Past Medical History:  Past Medical History:  Diagnosis Date  . Depression     Past Surgical History:  Procedure Laterality Date  . MANDIBLE FRACTURE SURGERY     Family History: History reviewed. No pertinent family history.  Family Psychiatric  History: See HPI  Social History:  History  Alcohol Use  . Yes     History  Drug Use  . Frequency: 7.0 times per week  . Types: Marijuana, Cocaine    Social History   Social History  . Marital status: Single    Spouse name: N/A  . Number of children: N/A  . Years of education: N/A   Social History Main Topics  . Smoking status: Current Every Day Smoker    Packs/day: 1.00    Types: Cigarettes  . Smokeless tobacco: Never Used  . Alcohol use Yes  . Drug use:     Frequency: 7.0 times per week    Types: Marijuana, Cocaine  . Sexual activity: Not Asked   Other Topics Concern  . None   Social History  Narrative  . None   Additional Social History:   Sleep: Poor, "I just can't sleep at night"  Appetite:  Fair  Current Medications: Current Facility-Administered Medications  Medication Dose Route Frequency Provider Last Rate Last Dose  . alum & mag hydroxide-simeth (MAALOX/MYLANTA) 200-200-20 MG/5ML suspension 30 mL  30 mL Oral Q4H PRN Kerry Hough, PA-C      . hydrOXYzine (ATARAX/VISTARIL) tablet 25 mg  25 mg Oral Q6H PRN Kerry Hough, PA-C   25 mg at 07/29/16 2348  . ibuprofen (ADVIL,MOTRIN) tablet 600 mg  600 mg Oral Q6H PRN Kerry Hough, PA-C      . magnesium hydroxide (MILK OF MAGNESIA) suspension 30 mL  30 mL Oral Daily PRN Kerry Hough, PA-C      . mirtazapine (REMERON) tablet 15 mg  15 mg Oral QHS [name], MD   15 mg at 07/29/16 2107  . QUEtiapine (SEROQUEL) tablet 100 mg  100 mg Oral QHS Sanjuana Kava, NP        Lab Results:  No results found for this or any previous visit (from the past 48 hour(s)).  Blood Alcohol level:  Lab Results  Component Value Date   ETH <5 07/24/2016    Metabolic Disorder Labs: No results found for: HGBA1C, MPG No results found for:  PROLACTIN No results found for: CHOL, TRIG, HDL, CHOLHDL, VLDL, LDLCALC  Physical Findings: AIMS: Facial and Oral Movements Muscles of Facial Expression: None, normal Lips and Perioral Area: None, normal Jaw: None, normal Tongue: None, normal,Extremity Movements Upper (arms, wrists, hands, fingers): None, normal Lower (legs, knees, ankles, toes): None, normal, Trunk Movements Neck, shoulders, hips: None, normal, Overall Severity Severity of abnormal movements (highest score from questions above): None, normal Incapacitation due to abnormal movements: None, normal Patient's awareness of abnormal movements (rate only patient's report): No Awareness, Dental Status Current problems with teeth and/or dentures?: No Does patient usually wear dentures?: No  CIWA:  CIWA-Ar Total: 0 COWS:  COWS  Total Score: 1  Musculoskeletal: Strength & Muscle Tone: within normal limits Gait & Station: normal Patient leans: N/A  Psychiatric Specialty Exam: Physical Exam  Constitutional: He appears well-developed.  HENT:  Head: Normocephalic.  Eyes: Pupils are equal, round, and reactive to light.  Neck: Normal range of motion.  Cardiovascular: Normal rate.   Respiratory: Effort normal.  GI: Soft.  Musculoskeletal: Normal range of motion.  Neurological: He is alert.    Review of Systems  Constitutional: Positive for malaise/fatigue.  HENT: Negative.   Eyes: Negative.   Respiratory: Negative.   Cardiovascular: Negative.   Gastrointestinal: Negative.   Genitourinary: Negative.   Skin: Negative.   Psychiatric/Behavioral: Positive for depression ("Improving"). Negative for hallucinations, memory loss, substance abuse and suicidal ideas. The patient is nervous/anxious ("Improving") and has insomnia ("Worsening").   All other systems reviewed and are negative.   Blood pressure (!) 124/95, pulse 90, temperature 98 F (36.7 C), resp. rate 16, height 5\' 7"  (1.702 m), weight 72.6 kg (160 lb), SpO2 100 %.Body mass index is 25.06 kg/m.  General Appearance: Fairly Groomed  Eye Contact:  Fair  Speech:  Clear and Coherent  Volume:  Normal  Mood:  "good today"  Affect:  Restricted- improving  Thought Process:  Coherent and Goal Directed  Orientation:  Full (Time, Place, and Person)  Thought Content:  Logical Perceptions: denies AH/VH  Suicidal Thoughts:  No  Homicidal Thoughts:  No  Memory:  intact  Judgement:  Fair  Insight:  Fair  Psychomotor Activity:  Normal  Concentration:  Concentration: Fair and Attention Span: Fair  Recall:  [employer]  Fund of Knowledge:  Fair  Language:  Fair  Akathisia:  No  Handed:  Right  AIMS (if indicated):     Assets:  Communication Skills Desire for Improvement  ADL's:  Intact  Cognition:  WNL  Sleep:  Number of Hours: 4.5   Assessment 50 year old  male admitted after overdosing 20 tabs of blood pressure medication in the setting of cocaine use for the first time, and discordance with his god mother.   # MDD # r/o Substance induced mood disorder Although he continues to endorse insomnia, there has been an improvement in his neurovegetative symptoms. Will continue mirtazapine.   Plans - Continue mirtazapine 15 mg for depression qhs. - Increased Seroquel to 100 mg Q hs for mood control/insomnia. - Reviewed labs; TSH 1.60, K normalized  - - Admit for crisis management and stabilization. - Medication management to reduce current symptoms to base line and improve the patient's overall level of functioning. - Monitor for the adverse effect of the medications and anger outbursts - Continue 15 minutes observation for safety concerns - Encouraged to participate in milieu therapy and group therapy counseling sessions and also work with coping skills -  Develop treatment plan to decrease  risk of relapse upon discharge and to reduce the need for readmission. -  Psycho-social education regarding relapse prevention and self care. - Health care follow up as needed for medical problems. - Restart home medications where appropriate.  Treatment Plan Summary: Daily contact with patient to assess and evaluate symptoms and progress in treatment  Sanjuana Kava, NP, PMHNP, FNP-BC. 07/30/2016, 1:12 PM

## 2016-07-31 DIAGNOSIS — F1220 Cannabis dependence, uncomplicated: Secondary | ICD-10-CM | POA: Diagnosis present

## 2016-07-31 DIAGNOSIS — F1420 Cocaine dependence, uncomplicated: Secondary | ICD-10-CM | POA: Clinically undetermined

## 2016-07-31 MED ORDER — MIRTAZAPINE 15 MG PO TABS: 15.0000 mg | ORAL_TABLET | Freq: Every day | ORAL | 0 refills | Status: AC

## 2016-07-31 MED ORDER — HYDROXYZINE HCL 25 MG PO TABS: 25.0000 mg | ORAL_TABLET | Freq: Four times a day (QID) | ORAL | 0 refills | Status: AC | PRN

## 2016-07-31 MED ORDER — QUETIAPINE FUMARATE 100 MG PO TABS: 100.0000 mg | ORAL_TABLET | Freq: Every day | ORAL | 0 refills | Status: AC

## 2016-07-31 NOTE — Progress Notes (Addendum)
  [facility] :  Will you be returning to the same living situation after discharge:  No. Patient plans to stay with family in [place] At discharge, do you have transportation home?: Yes,  friend to pick up Do you have the ability to pay for your medications: Yes,  patient will be provided with prescriptions at discharge  Release of information consent forms completed and in the chart;  Patient's signature needed at discharge.  Patient to Follow up at: Follow-up Information    [facility] .   Why:  Social Worker will contact you within 1-2 business days of discharge with appt information.  Contact information: [address]    [place], [place] [address] Tel. [phone]    Fax [phone]          Next level of care provider [facility]:no  Safety Planning and Suicide Prevention discussed: Yes,  with patient   Have you used any form of tobacco in the last 30 days? (Cigarettes, Smokeless Tobacco, Cigars, and/or Pipes): Yes  Has patient been referred to the Quitline?: Patient refused referral  Patient has been referred for addiction treatment: Yes  Edward Mcbride Edward Mcbride 07/31/2016, 9:42 AM

## 2016-07-31 NOTE — Progress Notes (Signed)
Patient ID: Garth SchlatterDarryl Mcbride, male   DOB: 08/19/1966, 50 y.o.   MRN: [id number] D: Patient focused on discharge today.  [facility] He denies any thoughts of self harm.  He denies HI/AVH. Patient rates his depression, hopelessness and anxiety as a 5.  His goal today is to "feel sorry for someone and put her out of my apartment. A: Continue to monitor medication management and MD orders.  Safety checks completed every 15 minutes per protocol.  Offer support and encouragement as needed. R: Patient is receptive to staff; his behavior is appropriate.

## 2016-07-31 NOTE — [facility] Suicide Risk Assessment (Signed)
[facility]   Principal Problem: Substance induced mood disorder [facility](HCC) Discharge Diagnoses:  Patient Active Problem List   Diagnosis Date Noted  . Cocaine use disorder, severe, dependence (HCC) [F14.20] 07/31/2016  . Cannabis use disorder, moderate, dependence (HCC) [F12.20] 07/31/2016  . Substance induced mood disorder (HCC) [F19.94] 07/25/2016    Total Time spent with patient: 30 minutes  Musculoskeletal: Strength & Muscle Tone: within normal limits Gait & Station: normal Patient leans: N/A  Psychiatric Specialty Exam: Review of Systems  Psychiatric/Behavioral: Positive for substance abuse.  All other systems reviewed and are negative.   Blood pressure (!) 127/96, pulse 91, temperature 97.4 F (36.3 C), temperature source Oral, resp. rate 16, height 5\' 7"  (1.702 m), weight 72.6 kg (160 lb), SpO2 100 %.Body mass index is 25.06 kg/m.  General Appearance: Casual  Eye Contact::  Fair  Speech:  Clear and Coherent409  Volume:  Normal  Mood:  Euthymic  Affect:  Appropriate  Thought Process:  Goal Directed and Descriptions of Associations: Intact  Orientation:  Full (Time, Place, and Person)  Thought Content:  Logical  Suicidal Thoughts:  No  Homicidal Thoughts:  No  Memory:  Immediate;   Fair Recent;   Fair Remote;   Fair  Judgement:  Fair  Insight:  Shallow  Psychomotor Activity:  Normal  Concentration:  Fair  Recall:  Fis[employer]Fair  Fund of Knowledge:Fair  Language: Fair  Akathisia:  No  Handed:  Right  AIMS (if indicated):     Assets:  Desire for Improvement  Sleep:  Number of Hours: 4.25  Cognition: WNL  ADL's:  Intact   Mental Status Per Nursing Assessment::   On Admission:  NA  Demographic Factors:  Male  Loss Factors: NA  Historical Factors: Impulsivity  Risk Reduction Factors:   Positive social support  Continued Clinical Symptoms:  Alcohol/Substance Abuse/Dependencies Previous Psychiatric Diagnoses and  Treatments  Cognitive Features That Contribute To Risk:  None    Suicide Risk:  Minimal: No identifiable suicidal ideation.  Patients presenting with no risk factors but with morbid ruminations; may be classified as minimal risk based on the severity of the depressive symptoms  Follow-up Information    [facility] .   Why:  Social Worker will contact you within 1-2 business days of discharge with appt information.  Contact information: [address]    [place], [place] [address] Tel. [phone]    Fax [phone]          Plan Of Care/Follow-up recommendations:  Activity:  NO RESTRICTIONS Diet:  REGULAR Tests:  AS NEEDED Other:  FOLLOW UP WITH AFTERCARE  Renlee Floor, MD 07/31/2016, 9:55 AM

## 2016-07-31 NOTE — Progress Notes (Signed)
Recreation Therapy Notes  Date: 07/31/16 Time: 0930 Location: [address] Hall Group Room  Group Topic: Stress Management  Goal Area(s) Addresses:  Patient will verbalize importance of using healthy stress management.  Patient will identify positive emotions associated with healthy stress management.   Intervention: Stress Management  Activity :  Guided Imagery.  LRT introduced the stress management technique of guided imagery to the patients.  LRT read a script so patients could participate in the activity.  Patients were to follow along as LRT read script to participate.  Education:  Stress Management, Discharge Planning.   Education Outcome: Needs additional education  Clinical Observations/Feedback: Pt did not attend group.   Jinnie Onley, LRT/CTRS         Kyelle Urbas A 07/31/2016 12:26 PM 

## 2016-07-31 NOTE — Progress Notes (Signed)
Patient did not attend the evening speaker AA meeting. Pt was notified that group was beginning but returned to his room.   

## 2016-07-31 NOTE — [facility] Suicide Risk Assessment (Signed)
[facility] INPATIENT:  Family/Significant Other Suicide Prevention Education  Suicide Prevention Education:  Contact Attempts: friend Charma IgoDebra Harris [phone], (name of family member/significant other) has been identified by the patient as the family member/significant other with whom the patient will be residing, and identified as the person(s) who will aid the patient in the event of a mental health crisis.  With written consent from the patient, two attempts were made to provide suicide prevention education, prior to and/or following the patient's discharge.  We were unsuccessful in providing suicide prevention education.  A suicide education pamphlet was given to the patient to share with family/significant other.  Date and time of first attempt: 07/31/16 at 9:40am Date and time of second attempt: 07/31/16 at 12:40pm  Dmitry Macomber L Jiraiya Mcewan 07/31/2016, 9:41 AM

## 2016-07-31 NOTE — Progress Notes (Signed)
D    Pt reports he is looking forward to going home tomorrow   He said he is ready and feels much better then he did prior to coming into the hospital  A    Verbal support given   Medications adminiistered and effectiveness monitored    Q 15 min checks R   Pt safe at present and receptive to verbal support

## 2016-07-31 NOTE — Discharge Summary (Signed)
Physician Discharge Summary Note  Patient:  Edward Mcbride is an 50 y.o., male MRN:  [id number] DOB:  1966/11/18 Patient phone:  [phone] (home)  Patient address:   [address] [place] [address],  Total Time spent with patient: Greater than 30 minutes  Date of Admission:  07/25/2016  Date of Discharge: 07-31-16  Reason for Admission: Suicide attempt by overdose.  Principal Problem: Substance induced mood disorder [facility])  Discharge Diagnoses: Patient Active Problem List   Diagnosis Date Noted  . Substance induced mood disorder [facility]) [F19.94] 07/25/2016   Past Psychiatric History: Major depression.  Past Medical History:  Past Medical History:  Diagnosis Date  . Depression     Past Surgical History:  Procedure Laterality Date  . MANDIBLE FRACTURE SURGERY     Family History: History reviewed. No pertinent family history.  Family Psychiatric  History: See H&P  Social History:  History  Alcohol Use  . Yes     History  Drug Use  . Frequency: 7.0 times per week  . Types: Marijuana, Cocaine    Social History   Social History  . Marital status: Single    Spouse name: N/A  . Number of children: N/A  . Years of education: N/A   Social History Main Topics  . Smoking status: Current Every Day Smoker    Packs/day: 1.00    Types: Cigarettes  . Smokeless tobacco: Never Used  . Alcohol use Yes  . Drug use:     Frequency: 7.0 times per week    Types: Marijuana, Cocaine  . Sexual activity: Not Asked   Other Topics Concern  . None   Social History Narrative  . None   Hospital Course: This is an admission assessment for Edward Mcbride, a 50 year old African-American male. Admitted to the [facility] adult unit from the [facility] ED with complaints of suicide attempt by overdose on 20 tablets of blood pressure medications & self-inflicted laceration to left arm. During this assessment, he reports, "I called 911 yesterday because I was feeling very depressed. I  have always been depressed. However, this is the first time that I felt the worst of it. A lot of stuff is going on in my life. I miss my entire family. They are in [place] & I'm here, in [place]. 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 But, we had an argument 2 weeks ago, so I left her house, then got very depressed. I have never taken any medication for depression, but will look into taking depression medicine when I finally get to [place]. I will leave to go to [place] after I get discharged from this hospital. I used crack Cocaine last Sunday, my very first time ever. I smoke weed everyday because it is helping to uplift my depression. I really do not know what you all will do for me until I talk to my Child psychotherapist or counselor".   Edward Mcbride was admitted to the hospital with his UDS test reports showing positive Cocaine &THC. However, his reason for admission was worsening symptoms of depression triggering suicide attempt by overdose. He cited his reason for the attempt as missing his family in [place] area. He was in need of mood stabilization treatment. After evaluation of his symptoms, Edward Mcbride was started on medication regimen for his presenting symptoms. His medication regimen included; Seroquel 100 mg for mood control/adjunct treatment for depression, Mirtazapine 15 mg  for depression/insomnia &  hydroxyzine 25 mg prn for anxiety. He was also enrolled & participated in the group counseling sessions being offered & held on this unit, he learned coping skills that should help him cope better and maintain mood stability after discharge. He presented no other significant pre-existing health issues that required treatment and or monitoring. Edward Mcbride tolerated his treatment regimen without any significant adverse effects and or reactions reported.  During the course of his hospitalization, Edward Mcbride's symptoms were evaluated on daily basis by a clinical provider to ascertain his  symptoms are responding to his treatment regimen. This is evidenced by his reports of decreasing symptoms, improved mood & presentation of good affect. He is currently being discharged to continue psychiatric treatment & medication management at the in the [place] area. He is provided with all the pertinent information required to make this appointment without problems.   On this day of his hospital discharge, Edward Mcbride is in much improved condition than upon admission. He contracted for his safety & felt more in control of his mood. His symptoms were reported as significantly decreased or resolved completely. He denied SI/HI and voiced no AVH. He is instructed & motivated to continue taking medications with a goal of continued improvement in mental health. Edward Mcbride was provided with some sample and prescriptions for all his discharge medications. He was picked up by his friend. He left [facility] in no apparent distress with all belongings.  Physical Findings: AIMS: Facial and Oral Movements Muscles of Facial Expression: None, normal Lips and Perioral Area: None, normal Jaw: None, normal Tongue: None, normal,Extremity Movements Upper (arms, wrists, hands, fingers): None, normal Lower (legs, knees, ankles, toes): None, normal, Trunk Movements Neck, shoulders, hips: None, normal, Overall Severity Severity of abnormal movements (highest score from questions above): None, normal Incapacitation due to abnormal movements: None, normal Patient's awareness of abnormal movements (rate only patient's report): No Awareness, Dental Status Current problems with teeth and/or dentures?: No Does patient usually wear dentures?: No  CIWA:  CIWA-Ar Total: 0 COWS:  COWS Total Score: 1  Musculoskeletal: Strength & Muscle Tone: within normal limits Gait & Station: normal Patient leans: N/A  Psychiatric Specialty Exam: Physical Exam  Constitutional: He appears well-developed.  HENT:  Head: Normocephalic.  Eyes: Pupils are  equal, round, and reactive to light.  Neck: Normal range of motion.  Cardiovascular: Normal rate.   Respiratory: Effort normal.  GI: Soft.    Review of Systems  Constitutional: Negative.   HENT: Negative.   Eyes: Negative.   Respiratory: Negative.   Cardiovascular: Negative.   Gastrointestinal: Negative.   Genitourinary: Negative.   Musculoskeletal: Negative.   Skin: Negative.   Neurological: Negative.   Endo/Heme/Allergies: Negative.   Psychiatric/Behavioral: Positive for depression (Stable) and substance abuse (Hx. Cocaine abuse). Negative for hallucinations, memory loss and suicidal ideas. The patient has insomnia (Stable). The patient is not nervous/anxious.     Blood pressure (!) 127/96, pulse 91, temperature 97.4 F (36.3 C), temperature source Oral, resp. rate 16, height 5\' 7"  (1.702 m), weight 72.6 kg (160 lb), SpO2 100 %.Body mass index is 25.06 kg/m.  See Md's SRA   Have you used any form of tobacco in the last 30 days? (Cigarettes, Smokeless Tobacco, Cigars, and/or Pipes): Yes  Has this patient used any form of tobacco in the last 30 days? (Cigarettes, Smokeless Tobacco, Cigars, and/or Pipes): No  Blood Alcohol level:  Lab Results  Component Value Date   ETH <5 07/24/2016   Metabolic Disorder Labs:  No results found for:  HGBA1C, MPG No results found for: PROLACTIN No results found for: CHOL, TRIG, HDL, CHOLHDL, VLDL, LDLCALC  See Psychiatric Specialty Exam and Suicide Risk Assessment completed by Attending Physician prior to discharge.  Discharge destination:  Home  Is patient on multiple antipsychotic therapies at discharge:  No   Has Patient had three or more failed trials of antipsychotic monotherapy by history:  No  Recommended Plan for Multiple Antipsychotic Therapies: NA    Medication List    STOP taking these medications   cephALEXin 500 MG capsule Commonly known as:  KEFLEX   methocarbamol 500 MG tablet Commonly known as:  ROBAXIN    naproxen 500 MG tablet Commonly known as:  NAPROSYN   oxyCODONE 5 MG immediate release tablet Commonly known as:  ROXICODONE   oxyCODONE-acetaminophen 5-325 MG tablet Commonly known as:  PERCOCET/ROXICET     TAKE these medications     Indication  hydrOXYzine 25 MG tablet Commonly known as:  ATARAX/VISTARIL Take 1 tablet (25 mg total) by mouth every 6 (six) hours as needed for anxiety.  Indication:  Anxiety   mirtazapine 15 MG tablet Commonly known as:  REMERON Take 1 tablet (15 mg total) by mouth at bedtime. For depression  Indication:  Trouble Sleeping, Major Depressive Disorder   QUEtiapine 100 MG tablet Commonly known as:  SEROQUEL Take 1 tablet (100 mg total) by mouth at bedtime. For mood control  Indication:  Mood control/insomnia.      Follow-up Information    [facility]   Why:  Social Worker will contact you within 1-2 business days of discharge with appt information.  Contact information: [address]    [place], [place] [address] Tel. [phone]    Fax [phone]         Follow-up recommendations:  Activity:  As tolerated Diet: As recommended by your primary care doctor. Keep all scheduled follow-up appointments as recommended.  Comments: Patient is instructed prior to discharge to: Take all medications as prescribed by his/her mental healthcare provider. Report any adverse effects and or reactions from the medicines to his/her outpatient provider promptly. Patient has been instructed & cautioned: To not engage in alcohol and or illegal drug use while on prescription medicines. In the event of worsening symptoms, patient is instructed to call the crisis hotline, 911 and or go to the nearest ED for appropriate evaluation and treatment of symptoms. To follow-up with his/her primary care provider for your other medical issues, concerns and or health care needs.   Signed: Sanjuana KavaNwoko, Avari Gelles I, NP, PMHNP, FNP-BC 07/31/2016, 9:08 AM

## 2016-07-31 NOTE — Tx Team (Signed)
Interdisciplinary Treatment and Diagnostic Plan Update  07/31/2016 Time of Session: 9:30am Breckan Cafiero MRN: [id number]  Principal Diagnosis: Substance induced mood disorder ([place])  Secondary Diagnoses: Principal Problem:   Substance induced mood disorder ([place])   Current Medications:  Current Facility-Administered Medications  Medication Dose Route Frequency Provider Last Rate Last Dose  . alum & mag hydroxide-simeth (MAALOX/MYLANTA) 200-200-20 MG/5ML suspension 30 mL  30 mL Oral Q4H PRN Laverle Hobby, PA-C      . hydrOXYzine (ATARAX/VISTARIL) tablet 25 mg  25 mg Oral Q6H PRN Laverle Hobby, PA-C   25 mg at 07/30/16 2116  . ibuprofen (ADVIL,MOTRIN) tablet 600 mg  600 mg Oral Q6H PRN Laverle Hobby, PA-C   600 mg at 07/30/16 1627  . magnesium hydroxide (MILK OF MAGNESIA) suspension 30 mL  30 mL Oral Daily PRN Laverle Hobby, PA-C      . mirtazapine (REMERON) tablet 15 mg  15 mg Oral QHS Norman Clay, MD   15 mg at 07/30/16 2116  . QUEtiapine (SEROQUEL) tablet 100 mg  100 mg Oral QHS Encarnacion Slates, NP   100 mg at 07/30/16 2116   PTA Medications: Prescriptions Prior to Admission  Medication Sig Dispense Refill Last Dose  . cephALEXin (KEFLEX) 500 MG capsule Take 1 capsule (500 mg total) by mouth 2 (two) times daily. (Patient not taking: Reported on 07/24/2016) 14 capsule 0 Unknown at Unknown time  . methocarbamol (ROBAXIN) 500 MG tablet Take 1 tablet (500 mg total) by mouth 2 (two) times daily as needed for muscle spasms. (Patient not taking: Reported on 07/24/2016) 20 tablet 0 Unknown at Unknown time  . naproxen (NAPROSYN) 500 MG tablet Take 1 tablet (500 mg total) by mouth 2 (two) times daily with a meal. (Patient not taking: Reported on 07/24/2016) 30 tablet 0 Unknown at Unknown time  . oxyCODONE (ROXICODONE) 5 MG immediate release tablet Take 1 tablet (5 mg total) by mouth every 6 (six) hours as needed for severe pain. (Patient not taking: Reported on 07/24/2016) 5 tablet 0 Unknown at  Unknown time  . oxyCODONE-acetaminophen (PERCOCET/ROXICET) 5-325 MG per tablet Take 1-2 tablets by mouth every 4 (four) hours as needed for moderate pain or severe pain. (Patient not taking: Reported on 07/24/2016) 10 tablet 0 Unknown at Unknown time    Treatment Modalities: Medication Management, Group therapy, Case management,  1 to 1 session with clinician, Psychoeducation, Recreational therapy.   Physician Treatment Plan for Primary Diagnosis: Substance induced mood disorder ([place]) Long Term Goal(s): Improvement in symptoms so as ready for discharge   Short Term Goals: Ability to identify changes in lifestyle to reduce recurrence of condition will improve, Ability to identify and develop effective coping behaviors will improve, Compliance with prescribed medications will improve and Ability to identify triggers associated with substance abuse/mental health issues will improve  Medication Management: Evaluate patient's response, side effects, and tolerance of medication regimen.  Therapeutic Interventions: 1 to 1 sessions, Unit Group sessions and Medication administration.  Evaluation of Outcomes: Met  Physician Treatment Plan for Secondary Diagnosis: Principal Problem:   Substance induced mood disorder ([place])  Long Term Goal(s): Improvement in symptoms so as ready for discharge  Short Term Goals: Ability to identify changes in lifestyle to reduce recurrence of condition will improve, Ability to demonstrate self-control will improve, Compliance with prescribed medications will improve and Ability to identify triggers associated with substance abuse/mental health issues will improve  Medication Management: Evaluate patient's response, side effects, and tolerance of medication regimen.  Therapeutic Interventions: 1 to 1 sessions, Unit Group sessions and Medication administration.  Evaluation of Outcomes: Met   RN Treatment Plan for Primary Diagnosis: Substance induced mood disorder  ([place]) Long Term Goal(s): Knowledge of disease and therapeutic regimen to maintain health will improve  Short Term Goals: Ability to demonstrate self-control, Ability to verbalize feelings will improve and Ability to identify and develop effective coping behaviors will improve  Medication Management: RN will administer medications as ordered by provider, will assess and evaluate patient's response and provide education to patient for prescribed medication. RN will report any adverse and/or side effects to prescribing provider.  Therapeutic Interventions: 1 on 1 counseling sessions, Psychoeducation, Medication administration, Evaluate responses to treatment, Monitor vital signs and CBGs as ordered, Perform/monitor CIWA, COWS, AIMS and Fall Risk screenings as ordered, Perform wound care treatments as ordered.  Evaluation of Outcomes: Met   LCSW Treatment Plan for Primary Diagnosis: Substance induced mood disorder ([place]) Long Term Goal(s): Safe transition to appropriate next level of care at discharge, Engage patient in therapeutic group addressing interpersonal concerns.  Short Term Goals: Engage patient in aftercare planning with referrals and resources, Increase social support, Increase emotional regulation and Increase skills for wellness and recovery  Therapeutic Interventions: Assess for all discharge needs, 1 to 1 time with Social worker, Explore available resources and support systems, Assess for adequacy in community support network, Educate family and significant other(s) on suicide prevention, Complete Psychosocial Assessment, Interpersonal group therapy.  Evaluation of Outcomes: Met   Progress in Treatment :  Attending groups: Intermittently  Participating in groups: Minimally  Taking medication as prescribed: Yes, MD continuing to assess for appropriate medication regimen  Toleration medication: Yes  Family/Significant other contact made: Treatment team assessing for  appropriate contacts  Patient understands diagnosis: Yes  Discussing patient identified problems/goals with staff: Yes  Medical problems stabilized or resolved: Yes  Denies suicidal/homicidal ideation: Yes, denies  Issues/concerns per patient self-inventory: None reported  Other: N/A  New problem(s) identified: None reported at this time    New Short Term/Long Term Goal(s): None at this time    Discharge Plan or Barriers: Patient plans to relocate to [place] to stay with family and to follow up with outpatient services.     Reason for Continuation of Hospitalization: Anxiety Depression Hallucinations Mania Medication stabilization Suicidal ideation Withdrawal symptoms  Estimated Length of Stay: Discharge anticipated for today 07/31/16    Attendees:  Patient:   Physician: Dr. Shea Evans , MD  07/31/2016   9:30am  Nursing: Mayra Neer, Marilynne Halsted, Christa Lorelle Formosa  07/31/2016 9:30am  RN Care Manager:   Social Workers: Tilden Fossa, Stem,  [place] LCSW 07/31/2016 9:30am  Nurse Pratictioners:   Scribe for Treatment Team: Tilden Fossa, [place] Worker [facility] [phone]

## 2016-07-31 NOTE — Progress Notes (Signed)
Patient ID: Garth SchlatterDarryl Mcbride, male   DOB: 08/17/1966, 50 y.o.   MRN: [id number] Discharge note:  Patient discharged home per MD order.  Patient received all personal belongings from unit and locker.  Patient denies any thoughts of self harm.  He denies HI/AVH.  Patient will follow up with outpatient provider in [place] when he returns.  Reviewed AVS/transition record with patient and he indicated understanding.  Patient received prescriptions and medication samples.  Patient left ambulatory with his ride.

## 2016-11-08 IMAGING — CR DG TOE GREAT 2+V*R*
3 series · 3 of 3 positions shown · non-contrast
Comparison: None.

CLINICAL DATA: Status post fall down stairs this morning with a
laceration about the right great toe. Initial encounter.

EXAM:
RIGHT GREAT TOE

[x toes ap right]
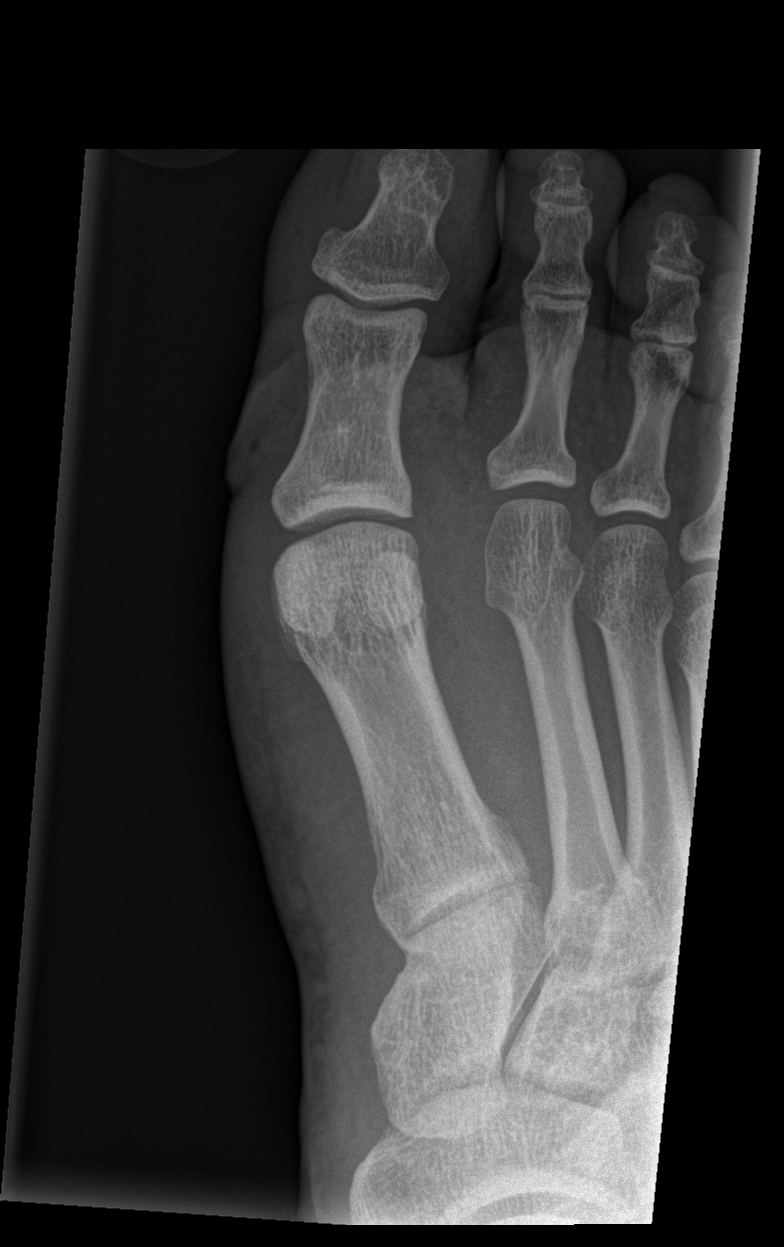

[x toes obl right]
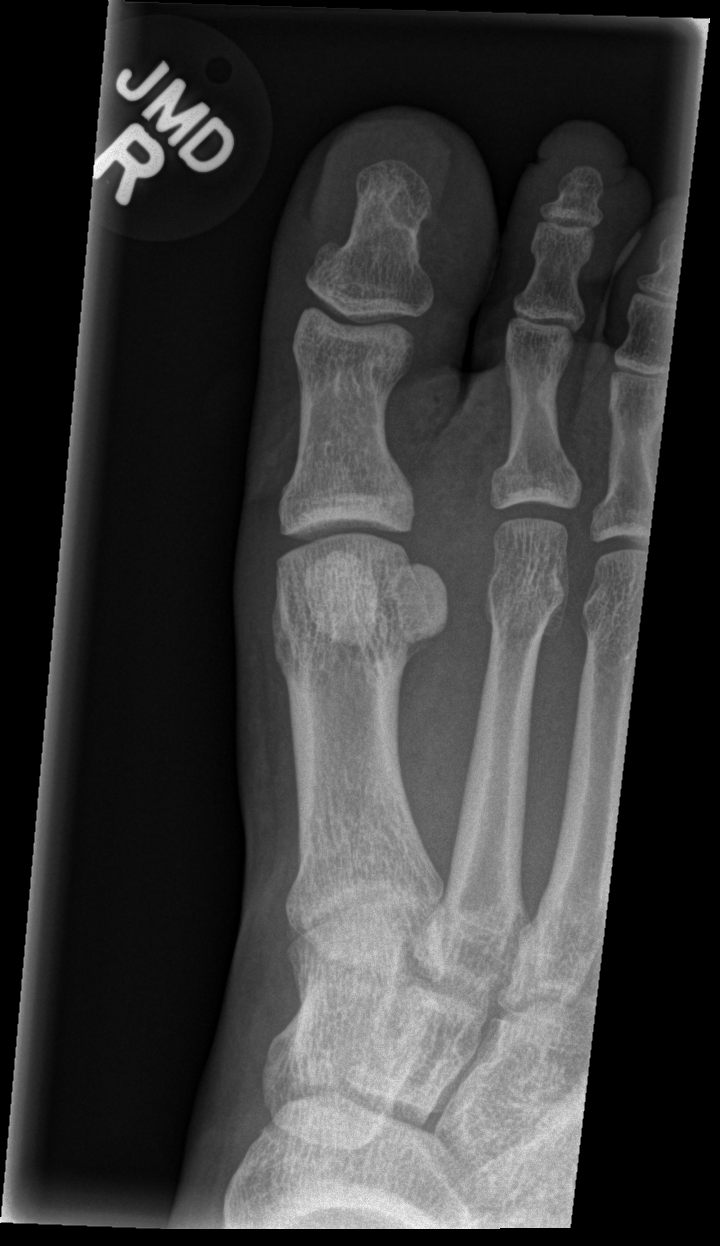

[x toes lat right]
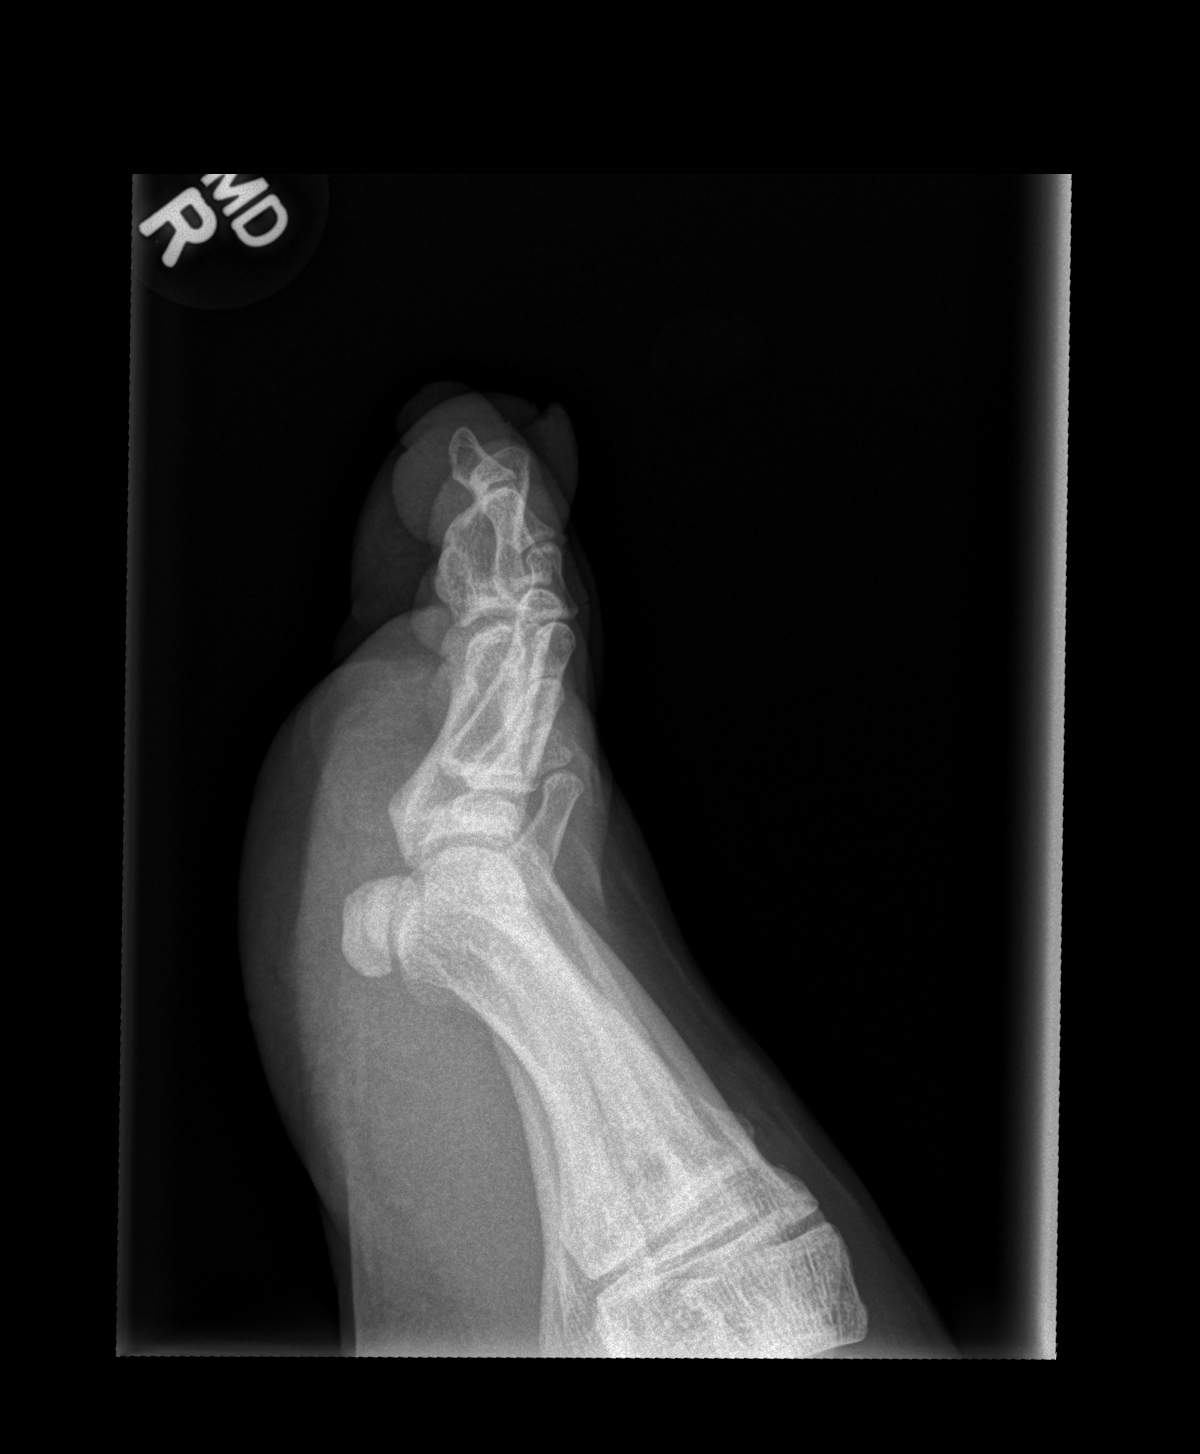

[3 of 3 positions shown; findings below may reference images not displayed]

FINDINGS: No bony or joint abnormality is identified. No radiopaque foreign
body is seen. The patient's laceration is not definitely visualized.
IMPRESSION: No acute abnormality.  Negative for foreign body.
